# Patient Record
Sex: Male | Born: 1955 | Race: Black or African American | Hispanic: No | Marital: Single | Smoking: Current every day smoker
Health system: Southern US, Community
[De-identification: ages and names within clinical notes are randomized; demographics above are authoritative.]

## PROBLEM LIST (undated history)

## (undated) DIAGNOSIS — K859 Acute pancreatitis without necrosis or infection, unspecified: Secondary | ICD-10-CM

## (undated) DIAGNOSIS — E119 Type 2 diabetes mellitus without complications: Secondary | ICD-10-CM

## (undated) HISTORY — PX: ORIF WRIST FRACTURE: SHX2133

## (undated) HISTORY — PX: TOE SURGERY: SHX1073

## (undated) HISTORY — PX: APPENDECTOMY: SHX54

---

## 2014-04-16 ENCOUNTER — Emergency Department (HOSPITAL_COMMUNITY): Payer: Medicare Other

## 2014-04-16 ENCOUNTER — Emergency Department (HOSPITAL_COMMUNITY)
Admission: EM | Admit: 2014-04-16 | Discharge: 2014-04-16 | Disposition: A | Discharge status: Home / Self Care | Payer: Medicare Other | Attending: Emergency Medicine | Admitting: Emergency Medicine

## 2014-04-16 ENCOUNTER — Encounter (HOSPITAL_COMMUNITY): Payer: Self-pay | Admitting: *Deleted

## 2014-04-16 DIAGNOSIS — N492 Inflammatory disorders of scrotum: Secondary | ICD-10-CM | POA: Diagnosis not present

## 2014-04-16 DIAGNOSIS — Z720 Tobacco use: Secondary | ICD-10-CM | POA: Insufficient documentation

## 2014-04-16 DIAGNOSIS — N50819 Testicular pain, unspecified: Secondary | ICD-10-CM

## 2014-04-16 DIAGNOSIS — E119 Type 2 diabetes mellitus without complications: Secondary | ICD-10-CM | POA: Insufficient documentation

## 2014-04-16 DIAGNOSIS — N508 Other specified disorders of male genital organs: Secondary | ICD-10-CM | POA: Diagnosis present

## 2014-04-16 HISTORY — DX: Type 2 diabetes mellitus without complications: E11.9

## 2014-04-16 LAB — CBC WITH DIFFERENTIAL/PLATELET
BASOS PCT: 0 % (ref 0–1)
Basophils Absolute: 0 10*3/uL (ref 0.0–0.1)
EOS PCT: 5 % (ref 0–5)
Eosinophils Absolute: 0.3 10*3/uL (ref 0.0–0.7)
HEMATOCRIT: 37.7 % — AB (ref 39.0–52.0)
Hemoglobin: 12.2 g/dL — ABNORMAL LOW (ref 13.0–17.0)
LYMPHS ABS: 1.4 10*3/uL (ref 0.7–4.0)
LYMPHS PCT: 26 % (ref 12–46)
MCH: 23.1 pg — ABNORMAL LOW (ref 26.0–34.0)
MCHC: 32.4 g/dL (ref 30.0–36.0)
MCV: 71.3 fL — ABNORMAL LOW (ref 78.0–100.0)
MONOS PCT: 12 % (ref 3–12)
Monocytes Absolute: 0.7 10*3/uL (ref 0.1–1.0)
NEUTROS ABS: 3.1 10*3/uL (ref 1.7–7.7)
Neutrophils Relative %: 57 % (ref 43–77)
PLATELETS: 188 10*3/uL (ref 150–400)
RBC: 5.29 MIL/uL (ref 4.22–5.81)
RDW: 15.2 % (ref 11.5–15.5)
WBC: 5.4 10*3/uL (ref 4.0–10.5)

## 2014-04-16 LAB — URINE MICROSCOPIC-ADD ON

## 2014-04-16 LAB — BASIC METABOLIC PANEL
ANION GAP: 9 (ref 5–15)
BUN: 7 mg/dL (ref 6–23)
CHLORIDE: 100 meq/L (ref 96–112)
CO2: 23 mmol/L (ref 19–32)
Calcium: 8.9 mg/dL (ref 8.4–10.5)
Creatinine, Ser: 0.85 mg/dL (ref 0.50–1.35)
GFR calc non Af Amer: >90 mL/min (ref >90)
Glucose, Bld: 464 mg/dL — ABNORMAL HIGH (ref 70–99)
POTASSIUM: 4.5 mmol/L (ref 3.5–5.1)
Sodium: 132 mmol/L — ABNORMAL LOW (ref 135–145)

## 2014-04-16 LAB — URINALYSIS, ROUTINE W REFLEX MICROSCOPIC
Bilirubin Urine: NEGATIVE
Glucose, UA: >1000 mg/dL — AB
Hgb urine dipstick: NEGATIVE
Ketones, ur: NEGATIVE mg/dL
LEUKOCYTES UA: NEGATIVE
Nitrite: NEGATIVE
Protein, ur: NEGATIVE mg/dL
Specific Gravity, Urine: 1.031 — ABNORMAL HIGH (ref 1.005–1.030)
Urobilinogen, UA: 0.2 mg/dL (ref 0.0–1.0)
pH: 6 (ref 5.0–8.0)

## 2014-04-16 MED ORDER — HYDROMORPHONE HCL 1 MG/ML IJ SOLN
1.0000 mg | Freq: Once | INTRAMUSCULAR | Status: AC
  Administered 2014-04-16: 1 mg via INTRAVENOUS
  Filled 2014-04-16: qty 1

## 2014-04-16 MED ORDER — OXYCODONE-ACETAMINOPHEN 5-325 MG PO TABS: 1.0000 | ORAL_TABLET | ORAL | Status: DC | PRN

## 2014-04-16 MED ORDER — LIDOCAINE-EPINEPHRINE (PF) 2 %-1:200000 IJ SOLN
20.0000 mL | Freq: Once | INTRAMUSCULAR | Status: AC
  Administered 2014-04-16: 20 mL
  Filled 2014-04-16: qty 20

## 2014-04-16 MED ORDER — CLINDAMYCIN HCL 300 MG PO CAPS: 300.0000 mg | ORAL_CAPSULE | Freq: Four times a day (QID) | ORAL | Status: DC

## 2014-04-16 NOTE — ED Notes (Signed)
Pt in c/o right testicle pain x4 days, reports redness and swelling, increased pain with movement, appears in pain

## 2014-04-16 NOTE — ED Notes (Signed)
Pt transported to US.  Blood pulled from IV line was hemolyzed. Will stick when returns from US.

## 2014-04-16 NOTE — ED Notes (Signed)
Md at bedside; Drainage of abscess in process

## 2014-04-16 NOTE — ED Provider Notes (Signed)
CSN: [id number]     Arrival date & time 04/16/14  1544 History   First MD Initiated Contact with Patient 04/16/14 1624     Chief Complaint  Patient presents with  . Testicle Pain     (Consider location/radiation/quality/duration/timing/severity/associated sxs/prior Treatment) Patient is a 59 y.o. male presenting with testicular pain.  Testicle Pain This is a new problem. Episode onset: 5 days ago. The problem occurs constantly. The problem has been gradually worsening. Pertinent negatives include no chest pain, no abdominal pain, no headaches and no shortness of breath. Exacerbated by: palpation. Nothing relieves the symptoms. He has tried nothing for the symptoms.    Past Medical History  Diagnosis Date  . Diabetes mellitus without complication    History reviewed. No pertinent past surgical history. History reviewed. No pertinent family history. History  Substance Use Topics  . Smoking status: Current Every Day Smoker  . Smokeless tobacco: Not on file  . Alcohol Use: Not on file    Review of Systems  Respiratory: Negative for shortness of breath.   Cardiovascular: Negative for chest pain.  Gastrointestinal: Negative for abdominal pain.  Genitourinary: Positive for testicular pain.  Neurological: Negative for headaches.  All other systems reviewed and are negative.     Allergies  Review of patient's allergies indicates no known allergies.  Home Medications   Prior to Admission medications   Medication Sig Start Date End Date Taking? Authorizing Provider  clindamycin (CLEOCIN) 300 MG capsule Take 1 capsule (300 mg total) by mouth 4 (four) times daily. X 7 days 04/16/14   Mirian Mo, MD   BP 125/78 mmHg  Pulse 79  Temp(Src) 97.8 F (36.6 C) (Oral)  Resp 14  SpO2 100% Physical Exam  Constitutional: He is oriented to person, place, and time. He appears well-developed and well-nourished.  HENT:  Head: Normocephalic and atraumatic.  Eyes: Conjunctivae and EOM are  normal.  Neck: Normal range of motion. Neck supple.  Cardiovascular: Normal rate, regular rhythm and normal heart sounds.   Pulmonary/Chest: Effort normal and breath sounds normal. No respiratory distress.  Abdominal: He exhibits no distension. There is no tenderness. There is no rebound and no guarding.  Genitourinary: Cremasteric reflex is not absent on the right side. Left testis shows no mass, no swelling and no tenderness. Left testis is descended. Cremasteric reflex is not absent on the left side.  Scrotal wall with fluctuant swelling, likely abscess  Musculoskeletal: Normal range of motion.  Neurological: He is alert and oriented to person, place, and time.  Skin: Skin is warm and dry.  Vitals reviewed.   ED Course  INCISION AND DRAINAGE Date/Time: 04/16/2014 7:34 PM Performed by: Mirian Mo Authorized by: Mirian Mo Consent: Verbal consent obtained. Type: abscess Body area: anogenital Location details: scrotal wall Anesthesia: local infiltration Local anesthetic: lidocaine 1% with epinephrine Risk factors: underlying structures and thin wall. Scalpel size: 11 Incision type: single straight Complexity: complex Drainage: purulent Drainage amount: copious Wound treatment: wound left open Packing material: none Patient tolerance: Patient tolerated the procedure well with no immediate complications   (including critical care time) Labs Review Labs Reviewed  CBC WITH DIFFERENTIAL - Abnormal; Notable for the following:    Hemoglobin 12.2 (*)    HCT 37.7 (*)    MCV 71.3 (*)    MCH 23.1 (*)    All other components within normal limits  BASIC METABOLIC PANEL - Abnormal; Notable for the following:    Sodium 132 (*)    Glucose, Bld 464 (*)  All other components within normal limits  URINALYSIS, ROUTINE W REFLEX MICROSCOPIC - Abnormal; Notable for the following:    Specific Gravity, Urine 1.031 (*)    Glucose, UA >1000 (*)    All other components within normal  limits  URINE MICROSCOPIC-ADD ON    Imaging Review US Scrotum  04/16/2014   CLINICAL DATA:  59 year old male with subacute right scrotal pain and swelling. Initial encounter.  EXAM: SCROTAL ULTRASOUND  DOPPLER ULTRASOUND OF THE TESTICLES  TECHNIQUE: Complete ultrasound examination of the testicles, epididymis, and other scrotal structures was performed. Color and spectral Doppler ultrasound were also utilized to evaluate blood flow to the testicles.  COMPARISON:  None.  FINDINGS: Right testicle  Measurements: 4.1 x 2.2 x 2.8 cm. No mass or microlithiasis visualized.  Left testicle  Measurements: 4 x 2.1 x 2.8 cm. No mass or microlithiasis visualized.  Right epididymis:  Normal in size and appearance.  Left epididymis:  Normal in size and appearance.  Hydrocele:  None visualized.  Varicocele:  None visualized.  Pulsed Doppler interrogation of both testes demonstrates low resistance arterial and venous waveforms bilaterally.  A 4.3 x 2.8 x 3.6 cm complex oval structure lateral to the right testicle is noted. Overlying erythema is present and the patient is extremely tender in this area. This most likely represents an area of infection/ abscess although there is possible internal vascular flow.  IMPRESSION: 4.3 x 2.8 x 3.6 cm complex oval structure lateral to the right testicle with overlying erythema -suspect area of infection/abscess.  Normal testicles bilaterally.  No evidence testicular torsion.   Electronically Signed   By: Laveda Abbe M.D.   On: 04/16/2014 17:53   [place] Art/ven Flow Abd Pelv Doppler  04/16/2014   CLINICAL DATA:  59 year old male with subacute right scrotal pain and swelling. Initial encounter.  EXAM: SCROTAL ULTRASOUND  DOPPLER ULTRASOUND OF THE TESTICLES  TECHNIQUE: Complete ultrasound examination of the testicles, epididymis, and other scrotal structures was performed. Color and spectral Doppler ultrasound were also utilized to evaluate blood flow to the testicles.  COMPARISON:  None.   FINDINGS: Right testicle  Measurements: 4.1 x 2.2 x 2.8 cm. No mass or microlithiasis visualized.  Left testicle  Measurements: 4 x 2.1 x 2.8 cm. No mass or microlithiasis visualized.  Right epididymis:  Normal in size and appearance.  Left epididymis:  Normal in size and appearance.  Hydrocele:  None visualized.  Varicocele:  None visualized.  Pulsed Doppler interrogation of both testes demonstrates low resistance arterial and venous waveforms bilaterally.  A 4.3 x 2.8 x 3.6 cm complex oval structure lateral to the right testicle is noted. Overlying erythema is present and the patient is extremely tender in this area. This most likely represents an area of infection/ abscess although there is possible internal vascular flow.  IMPRESSION: 4.3 x 2.8 x 3.6 cm complex oval structure lateral to the right testicle with overlying erythema -suspect area of infection/abscess.  Normal testicles bilaterally.  No evidence testicular torsion.   Electronically Signed   By: Laveda Abbe M.D.   On: 04/16/2014 17:53     EKG Interpretation None      MDM   Final diagnoses:  Testicular pain  Scrotal wall abscess    59 y.o. male with pertinent PMH of DM presents with r sided scrotal swelling and pain.  Exam as above with concern for abscess, however due to the amount of swelling, obtained US of scrotum to ensure that the pt did not have an  underlying herniation or testicular pathology, including torsion, as he had been having intermittent pain throughout the day and had testicular tenderness on that side.  US as above with scrotal wall abscess.  I spoke with urology Dr. Micah FlesherMacaormid, who personally reviewed the images, and recommended I&D in the ED.  Specifically asked if this needed urology fu and this was not recommended.  This was performed as above. Complicated drainage due to underlying structures and location.  DC home to fu with PCP.  I have reviewed all laboratory and imaging studies if ordered as above  1. Scrotal  wall abscess   2. Testicular pain         Mirian MoMatthew Korbyn Vanes, MD 04/16/14 [phone]

## 2014-04-16 NOTE — Discharge Instructions (Signed)

## 2014-04-18 ENCOUNTER — Telehealth (HOSPITAL_COMMUNITY): Payer: Self-pay

## 2015-02-08 ENCOUNTER — Encounter (HOSPITAL_COMMUNITY): Payer: Self-pay | Admitting: Emergency Medicine

## 2015-02-08 ENCOUNTER — Emergency Department (HOSPITAL_COMMUNITY): Payer: Medicare Other

## 2015-02-08 ENCOUNTER — Observation Stay (HOSPITAL_COMMUNITY)
Admission: EM | Admit: 2015-02-08 | Discharge: 2015-02-09 | Disposition: A | Discharge status: Home / Self Care | Payer: Medicare Other | Attending: Internal Medicine | Admitting: Internal Medicine

## 2015-02-08 DIAGNOSIS — T68XXXA Hypothermia, initial encounter: Secondary | ICD-10-CM

## 2015-02-08 DIAGNOSIS — Z720 Tobacco use: Secondary | ICD-10-CM | POA: Diagnosis not present

## 2015-02-08 DIAGNOSIS — E872 Acidosis, unspecified: Secondary | ICD-10-CM | POA: Diagnosis present

## 2015-02-08 DIAGNOSIS — G250 Essential tremor: Secondary | ICD-10-CM | POA: Insufficient documentation

## 2015-02-08 DIAGNOSIS — E11649 Type 2 diabetes mellitus with hypoglycemia without coma: Principal | ICD-10-CM | POA: Insufficient documentation

## 2015-02-08 DIAGNOSIS — E089 Diabetes mellitus due to underlying condition without complications: Secondary | ICD-10-CM

## 2015-02-08 DIAGNOSIS — R001 Bradycardia, unspecified: Secondary | ICD-10-CM | POA: Diagnosis present

## 2015-02-08 DIAGNOSIS — Z79899 Other long term (current) drug therapy: Secondary | ICD-10-CM | POA: Insufficient documentation

## 2015-02-08 DIAGNOSIS — E162 Hypoglycemia, unspecified: Secondary | ICD-10-CM | POA: Diagnosis present

## 2015-02-08 DIAGNOSIS — E119 Type 2 diabetes mellitus without complications: Secondary | ICD-10-CM

## 2015-02-08 DIAGNOSIS — IMO0001 Reserved for inherently not codable concepts without codable children: Secondary | ICD-10-CM

## 2015-02-08 DIAGNOSIS — Z794 Long term (current) use of insulin: Secondary | ICD-10-CM | POA: Diagnosis not present

## 2015-02-08 DIAGNOSIS — D539 Nutritional anemia, unspecified: Secondary | ICD-10-CM

## 2015-02-08 DIAGNOSIS — K859 Acute pancreatitis without necrosis or infection, unspecified: Secondary | ICD-10-CM | POA: Diagnosis not present

## 2015-02-08 HISTORY — DX: Acute pancreatitis without necrosis or infection, unspecified: K85.90

## 2015-02-08 LAB — I-STAT CHEM 8, ED
BUN: 6 mg/dL (ref 6–20)
CALCIUM ION: 1.11 mmol/L — AB (ref 1.13–1.30)
CREATININE: 0.9 mg/dL (ref 0.61–1.24)
Chloride: 101 mmol/L (ref 101–111)
GLUCOSE: 73 mg/dL (ref 65–99)
HCT: 36 % — ABNORMAL LOW (ref 39.0–52.0)
Hemoglobin: 12.2 g/dL — ABNORMAL LOW (ref 13.0–17.0)
Potassium: 4.2 mmol/L (ref 3.5–5.1)
Sodium: 141 mmol/L (ref 135–145)
TCO2: 28 mmol/L (ref 0–100)

## 2015-02-08 LAB — CBG MONITORING, ED
GLUCOSE-CAPILLARY: 99 mg/dL (ref 65–99)
GLUCOSE-CAPILLARY: 141 mg/dL — AB (ref 65–99)
Glucose-Capillary: 76 mg/dL (ref 65–99)
Glucose-Capillary: 107 mg/dL — ABNORMAL HIGH (ref 65–99)

## 2015-02-08 LAB — COMPREHENSIVE METABOLIC PANEL
ALT: 26 U/L (ref 17–63)
AST: 42 U/L — ABNORMAL HIGH (ref 15–41)
Albumin: 1.9 g/dL — ABNORMAL LOW (ref 3.5–5.0)
Alkaline Phosphatase: 122 U/L (ref 38–126)
Anion gap: 9 (ref 5–15)
BUN: 7 mg/dL (ref 6–20)
CO2: 28 mmol/L (ref 22–32)
Calcium: 8 mg/dL — ABNORMAL LOW (ref 8.9–10.3)
Chloride: 100 mmol/L — ABNORMAL LOW (ref 101–111)
Creatinine, Ser: 0.91 mg/dL (ref 0.61–1.24)
GFR calc Af Amer: >60 mL/min (ref >60)
GFR calc non Af Amer: >60 mL/min (ref >60)
Glucose, Bld: 69 mg/dL (ref 65–99)
Potassium: 4.2 mmol/L (ref 3.5–5.1)
Sodium: 137 mmol/L (ref 135–145)
Total Bilirubin: 1 mg/dL (ref 0.3–1.2)
Total Protein: 4.7 g/dL — ABNORMAL LOW (ref 6.5–8.1)

## 2015-02-08 LAB — CBC WITH DIFFERENTIAL/PLATELET
BASOS ABS: 0 10*3/uL (ref 0.0–0.1)
Basophils Relative: 0 %
EOS PCT: 1 %
Eosinophils Absolute: 0.1 10*3/uL (ref 0.0–0.7)
HCT: 30.8 % — ABNORMAL LOW (ref 39.0–52.0)
Hemoglobin: 10.2 g/dL — ABNORMAL LOW (ref 13.0–17.0)
LYMPHS PCT: 47 %
Lymphs Abs: 2 10*3/uL (ref 0.7–4.0)
MCH: 22.1 pg — ABNORMAL LOW (ref 26.0–34.0)
MCHC: 33.1 g/dL (ref 30.0–36.0)
MCV: 66.7 fL — AB (ref 78.0–100.0)
MONO ABS: 0.2 10*3/uL (ref 0.1–1.0)
MONOS PCT: 5 %
Neutro Abs: 1.9 10*3/uL (ref 1.7–7.7)
Neutrophils Relative %: 46 %
PLATELETS: 238 10*3/uL (ref 150–400)
RBC: 4.62 MIL/uL (ref 4.22–5.81)
RDW: 15 % (ref 11.5–15.5)
WBC: 4.1 10*3/uL (ref 4.0–10.5)

## 2015-02-08 LAB — URINALYSIS, ROUTINE W REFLEX MICROSCOPIC
BILIRUBIN URINE: NEGATIVE
GLUCOSE, UA: 250 mg/dL — AB
HGB URINE DIPSTICK: NEGATIVE
Ketones, ur: NEGATIVE mg/dL
Leukocytes, UA: NEGATIVE
Nitrite: NEGATIVE
PROTEIN: NEGATIVE mg/dL
Specific Gravity, Urine: 1.012 (ref 1.005–1.030)
UROBILINOGEN UA: 0.2 mg/dL (ref 0.0–1.0)
pH: 6 (ref 5.0–8.0)

## 2015-02-08 LAB — I-STAT CG4 LACTIC ACID, ED
Lactic Acid, Venous: 1.16 mmol/L (ref 0.5–2.0)
Lactic Acid, Venous: 3.54 mmol/L (ref 0.5–2.0)
Lactic Acid, Venous: 3.48 mmol/L (ref 0.5–2.0)

## 2015-02-08 LAB — LACTIC ACID, PLASMA: Lactic Acid, Venous: 3.3 mmol/L (ref 0.5–2.0)

## 2015-02-08 LAB — TROPONIN I

## 2015-02-08 LAB — GLUCOSE, CAPILLARY: Glucose-Capillary: 130 mg/dL — ABNORMAL HIGH (ref 65–99)

## 2015-02-08 LAB — LIPASE, BLOOD: Lipase: 12 U/L (ref 11–51)

## 2015-02-08 MED ORDER — CITALOPRAM HYDROBROMIDE 20 MG PO TABS
20.0000 mg | ORAL_TABLET | Freq: Every day | ORAL | Status: DC
  Administered 2015-02-08 – 2015-02-09 (×2): 20 mg via ORAL
  Filled 2015-02-08 (×2): qty 1

## 2015-02-08 MED ORDER — SODIUM CHLORIDE 0.9 % IJ SOLN
3.0000 mL | Freq: Two times a day (BID) | INTRAMUSCULAR | Status: DC
  Administered 2015-02-08 – 2015-02-09 (×2): 3 mL via INTRAVENOUS

## 2015-02-08 MED ORDER — AMITRIPTYLINE HCL 50 MG PO TABS
50.0000 mg | ORAL_TABLET | Freq: Every day | ORAL | Status: DC
  Administered 2015-02-08: 50 mg via ORAL
  Filled 2015-02-08: qty 1

## 2015-02-08 MED ORDER — OXYCODONE-ACETAMINOPHEN 5-325 MG PO TABS
1.0000 | ORAL_TABLET | Freq: Once | ORAL | Status: AC
  Administered 2015-02-08: 1 via ORAL
  Filled 2015-02-08: qty 1

## 2015-02-08 MED ORDER — INSULIN ASPART 100 UNIT/ML ~~LOC~~ SOLN: 0.0000 [IU] | Freq: Every day | SUBCUTANEOUS | Status: DC

## 2015-02-08 MED ORDER — SODIUM CHLORIDE 0.9 % IV BOLUS (SEPSIS)
500.0000 mL | Freq: Once | INTRAVENOUS | Status: DC
  Administered 2015-02-08: 500 mL via INTRAVENOUS

## 2015-02-08 MED ORDER — PROPRANOLOL HCL 40 MG PO TABS
40.0000 mg | ORAL_TABLET | Freq: Two times a day (BID) | ORAL | Status: DC
  Administered 2015-02-08 – 2015-02-09 (×2): 40 mg via ORAL
  Filled 2015-02-08 (×4): qty 1

## 2015-02-08 MED ORDER — OXYCODONE-ACETAMINOPHEN 5-325 MG PO TABS
1.0000 | ORAL_TABLET | ORAL | Status: DC | PRN
  Administered 2015-02-09: 1 via ORAL
  Administered 2015-02-09: 2 via ORAL
  Administered 2015-02-09: 1 via ORAL
  Administered 2015-02-09: 2 via ORAL
  Filled 2015-02-08: qty 1
  Filled 2015-02-08: qty 2
  Filled 2015-02-08: qty 1
  Filled 2015-02-08: qty 2

## 2015-02-08 MED ORDER — PIPERACILLIN-TAZOBACTAM 3.375 G IVPB 30 MIN
3.3750 g | Freq: Once | INTRAVENOUS | Status: DC
Start: 2015-02-08 — End: 2015-02-08

## 2015-02-08 MED ORDER — SODIUM CHLORIDE 0.9 % IV BOLUS (SEPSIS)
1000.0000 mL | Freq: Once | INTRAVENOUS | Status: AC
  Administered 2015-02-08: 1000 mL via INTRAVENOUS

## 2015-02-08 MED ORDER — DEXTROSE 50 % IV SOLN
1.0000 | Freq: Once | INTRAVENOUS | Status: AC
  Administered 2015-02-08: 50 mL via INTRAVENOUS
  Filled 2015-02-08: qty 50

## 2015-02-08 MED ORDER — INSULIN GLARGINE 100 UNIT/ML ~~LOC~~ SOLN
5.0000 [IU] | Freq: Every day | SUBCUTANEOUS | Status: DC
  Administered 2015-02-08: 5 [IU] via SUBCUTANEOUS
  Filled 2015-02-08 (×2): qty 0.05

## 2015-02-08 MED ORDER — INSULIN ASPART 100 UNIT/ML ~~LOC~~ SOLN
0.0000 [IU] | Freq: Three times a day (TID) | SUBCUTANEOUS | Status: DC
  Administered 2015-02-09: 1 [IU] via SUBCUTANEOUS

## 2015-02-08 MED ORDER — VANCOMYCIN HCL 10 G IV SOLR
1500.0000 mg | Freq: Once | INTRAVENOUS | Status: DC
  Filled 2015-02-08: qty 1500

## 2015-02-08 MED ORDER — SODIUM CHLORIDE 0.9 % IV BOLUS (SEPSIS)
2000.0000 mL | Freq: Once | INTRAVENOUS | Status: AC
  Administered 2015-02-08: 2000 mL via INTRAVENOUS

## 2015-02-08 NOTE — ED Notes (Signed)
PTs CBG 76 reported ot RN, [employer]

## 2015-02-08 NOTE — ED Notes (Signed)
From home via [facility], pt was hypoglycemic (CBG 30), bradycardic (HR 36) and altered on scene.  CBG 104 after 1 amp D50, HR 59 on arrival, A/O X4, no pain or distress

## 2015-02-08 NOTE — Discharge Instructions (Signed)

## 2015-02-08 NOTE — ED Notes (Signed)
Pt remains monitored by blood pressure, pulse ox, an 12 lead. Phlebotomy at bedside.

## 2015-02-08 NOTE — ED Provider Notes (Signed)
I assumed care of Collin Goodwin from [employer], PA-C at their end-of-shift check-out. Patient is a 59 y.o. male who presented for evaluation of Hypoglycemia and Bradycardia  I have reviewed their documentation of the patient's HPI, ROS, Physical Exam.  Please refer to their MDM and documentation for course prior to now. Treatments, Labs, and Imaging personally viewed by myself & considered in my MDM.  BP 121/72 mmHg  Pulse 75  Temp(Src) 98.1 F (36.7 C) (Oral)  Resp 14  Ht  (1.981 m)  Wt 199 lb 9.6 oz (90.538 kg)  BMI 23.07 kg/m2  SpO2 100% Results for orders placed or performed during the hospital encounter of 02/08/15  Comprehensive metabolic panel  Result Value Ref Range   Sodium 137 135 - 145 mmol/L   Potassium 4.2 3.5 - 5.1 mmol/L   Chloride 100 (L) 101 - 111 mmol/L   CO2 28 22 - 32 mmol/L   Glucose, Bld 69 65 - 99 mg/dL   BUN 7 6 - 20 mg/dL   Creatinine, Ser 8.29 0.61 - 1.24 mg/dL   Calcium 8.0 (L) 8.9 - 10.3 mg/dL   Total Protein 4.7 (L) 6.5 - 8.1 g/dL   Albumin 1.9 (L) 3.5 - 5.0 g/dL   AST 42 (H) 15 - 41 U/L   ALT 26 17 - 63 U/L   Alkaline Phosphatase 122 38 - 126 U/L   Total Bilirubin 1.0 0.3 - 1.2 mg/dL   GFR calc non Af Amer >60 >60 mL/min   GFR calc Af Amer >60 >60 mL/min   Anion gap 9 5 - 15  Lipase, blood  Result Value Ref Range   Lipase 12 11 - 51 U/L  Troponin I  Result Value Ref Range   Troponin I <0.03 <0.031 ng/mL  Urinalysis, Routine w reflex microscopic (not at [facility])  Result Value Ref Range   Color, Urine YELLOW YELLOW   APPearance CLEAR CLEAR   Specific Gravity, Urine 1.012 1.005 - 1.030   pH 6.0 5.0 - 8.0   Glucose, UA 250 (A) NEGATIVE mg/dL   Hgb urine dipstick NEGATIVE NEGATIVE   Bilirubin Urine NEGATIVE NEGATIVE   Ketones, ur NEGATIVE NEGATIVE mg/dL   Protein, ur NEGATIVE NEGATIVE mg/dL   Urobilinogen, UA 0.2 0.0 - 1.0 mg/dL   Nitrite NEGATIVE NEGATIVE   Leukocytes, UA NEGATIVE NEGATIVE  CBC with Differential  Result Value  Ref Range   WBC 4.1 4.0 - 10.5 K/uL   RBC 4.62 4.22 - 5.81 MIL/uL   Hemoglobin 10.2 (L) 13.0 - 17.0 g/dL   HCT 56.2 (L) 13.0 - 86.5 %   MCV 66.7 (L) 78.0 - 100.0 fL   MCH 22.1 (L) 26.0 - 34.0 pg   MCHC 33.1 30.0 - 36.0 g/dL   RDW 78.4 69.6 - 29.5 %   Platelets 238 150 - 400 K/uL   Neutrophils Relative % 46 %   Neutro Abs 1.9 1.7 - 7.7 K/uL   Lymphocytes Relative 47 %   Lymphs Abs 2.0 0.7 - 4.0 K/uL   Monocytes Relative 5 %   Monocytes Absolute 0.2 0.1 - 1.0 K/uL   Eosinophils Relative 1 %   Eosinophils Absolute 0.1 0.0 - 0.7 K/uL   Basophils Relative 0 %   Basophils Absolute 0.0 0.0 - 0.1 K/uL  Lactic acid, plasma  Result Value Ref Range   Lactic Acid, Venous 3.3 (HH) 0.5 - 2.0 mmol/L  Protime-INR  Result Value Ref Range   Prothrombin Time 15.3 (H) 11.6 - 15.2  seconds   INR 1.19 0.00 - 1.49  Glucose, capillary  Result Value Ref Range   Glucose-Capillary 130 (H) 65 - 99 mg/dL  Glucose, capillary  Result Value Ref Range   Glucose-Capillary 212 (H) 65 - 99 mg/dL  Glucose, capillary  Result Value Ref Range   Glucose-Capillary 163 (H) 65 - 99 mg/dL  I-Stat CG4 Lactic Acid, ED  Result Value Ref Range   Lactic Acid, Venous 1.16 0.5 - 2.0 mmol/L  CBG monitoring, ED  Result Value Ref Range   Glucose-Capillary 76 65 - 99 mg/dL  I-Stat Chem 8, ED  Result Value Ref Range   Sodium 141 135 - 145 mmol/L   Potassium 4.2 3.5 - 5.1 mmol/L   Chloride 101 101 - 111 mmol/L   BUN 6 6 - 20 mg/dL   Creatinine, Ser 1.610.90 0.61 - 1.24 mg/dL   Glucose, Bld 73 65 - 99 mg/dL   Calcium, Ion 0.961.11 (L) 1.13 - 1.30 mmol/L   TCO2 28 0 - 100 mmol/L   Hemoglobin 12.2 (L) 13.0 - 17.0 g/dL   HCT 04.536.0 (L) 40.939.0 - 81.152.0 %  CBG monitoring, ED  Result Value Ref Range   Glucose-Capillary 99 65 - 99 mg/dL  I-Stat CG4 Lactic Acid, ED  Result Value Ref Range   Lactic Acid, Venous 3.54 (HH) 0.5 - 2.0 mmol/L   Comment NOTIFIED PHYSICIAN   POC CBG, ED  Result Value Ref Range   Glucose-Capillary 107 (H) 65 -  99 mg/dL  I-Stat CG4 Lactic Acid, ED  Result Value Ref Range   Lactic Acid, Venous 3.48 (HH) 0.5 - 2.0 mmol/L   Comment NOTIFIED PHYSICIAN   CBG monitoring, ED  Result Value Ref Range   Glucose-Capillary 141 (H) 65 - 99 mg/dL   Dg Chest Portable 1 View  02/08/2015  CLINICAL DATA:  Hypoglycemia.  Diabetes. EXAM: PORTABLE CHEST 1 VIEW COMPARISON:  None. FINDINGS: The heart size and mediastinal contours are within normal limits. Both lungs are clear. The visualized skeletal structures are unremarkable. IMPRESSION: No active disease. Electronically Signed   By: Signa Kellaylor  Stroud M.D.   On: 02/08/2015 14:43   Upon assumption of the patient's care, they require repeat lactic acid and BG following episode of symptomatic hypoglycemia. Patient alert well appearing and tolerating PO with no AMS.  I bolused patient with and additional D50 and 2 L IVF for lactic acid clearance given patient markedly dehydrated on exam.  ED course update, reveals unable to clear lactic acidosis therefore I I obtained consultation with the Hospitalist service for concerns of unknown etiology causing hypoglycemic episodes. I discussed the patients clinical course including their H&P, as well as, their diagnostic studies. Based upon that discussion, we've decided that the patient requires admission at this time.   Clinical Impression:  1. Hypoglycemia    Disposition:  Patient care discussed with Dr. Jeraldine LootsLockwood, who oversaw their evaluation & treatment & voiced agreement. House Officer: Jonette EvaBrad Cesilia Shinn, MD, Emergency Medicine.  Jonette EvaBrad Kyisha Fowle, MD 02/09/15 [phone]  Gerhard Munchobert Lockwood, MD 02/10/15 [phone]

## 2015-02-08 NOTE — H&P (Signed)
History and Physical  Patient Name: Collin Goodwin     WGN:[id number]    DOB: April 28, 1955    DOA: 02/08/2015 Referring physician: Jonette EvaBrad Chapman, MD PCP: Dr. [place]ehuda [place]Spears in [place]      Chief Complaint: Hypoglycemia and confusion  HPI: Collin LightningCecil Goodwin is a 59 y.o. male with a past medical history significant for severe pancreatitis 5 years ago resulting in DM who presents with hypothermia, hypoglycemia, brief confusion, bradycardia and lactic acidosis.  The [place] lives in [place] and is visiting town for the football game, staying with friends/family.  He was in his usual state of health until about 3 days ago when he started to have recurrent morning hypoglycemia.  Today he woke up, his morning blood sugar was 140 mg/dL, and before he had eaten anything or taken any insulin he felt symptoms of low blood sugar, sweats, clouded thinking, and confusion.  His friend/family that he was staying with called EMS who found him with blood sugar of 39 g/dL, heart rate 36 bpm, and confused.   He was given 1 amp of D50 and by the time of arrival in the ER his rectal temperature was 91.51F and he was put on a bear hugger, but the patient was alert and oriented and had no complaints, and his blood sugar and blood pressure were normal.    He had no leukocytosis and initial lactate was 1.16 normal is per liter. He did have a slightly elevated AST of 42  U/L, and interestingly her repeat lactic acid level was 3.54 mmol/L, so he was given 2 L normal saline and follow-up lactic acid level was still greater than 3 and so [facility] was asked to admit for observation.   Of note, the patient  draws up his own Lantus and Apridra, and reports that he has been using new syringes and needles in the last four or five days.  No other recent changes to medicines.     Review of Systems:  Pt complains of recurrent hypoglycemia in the last several days. Pt denies any  fever, chills, sweats, emesis, cough, abdominal pain, diarrhea,  hematochezia, skin infections, dysuria, lower urinary tract symptoms.  All other systems negative except as just noted or noted in the history of present illness.  No Known Allergies  Prior to Admission medications   Medication Sig Start Date End Date Taking? Authorizing Provider  amitriptyline (ELAVIL) 50 MG tablet Take 50 mg by mouth at bedtime.   Yes Historical Provider, MD  citalopram (CELEXA) 20 MG tablet Take 20 mg by mouth daily.   Yes Historical Provider, MD  insulin glargine (LANTUS) 100 UNIT/ML injection Inject 10 Units into the skin at bedtime.   Yes Historical Provider, MD  insulin glulisine (APIDRA) 100 UNIT/ML injection Inject 0-8 Units into the skin 3 (three) times daily before meals. Sliding scale   Yes Historical Provider, MD  propranolol (INDERAL) 80 MG tablet Take 80 mg by mouth 2 (two) times daily.   Yes Historical Provider, MD  Vitamin D, Ergocalciferol, (DRISDOL) 50000 UNITS CAPS capsule Take 50,000 Units by mouth. Monday and Thursday   Yes Historical Provider, MD  oxyCODONE-acetaminophen (PERCOCET/ROXICET) 5-325 MG per tablet Take 1-2 tablets by mouth every 4 (four) hours as needed for severe pain. 04/16/14   Mirian MoMatthew Gentry, MD    Past Medical History  Diagnosis Date  . Diabetes mellitus without complication (HCC)   . Pancreatitis     Past Surgical History  Procedure Laterality Date  . Appendectomy    .  Orif wrist fracture    . Toe surgery      Family history: family history includes Arthritis in his mother; Heart disease in his brother; Liver cancer in his brother; Lupus in his brother; Pancreatitis in his father.  Social History: Patient lives alone in [place].  He is disabled after his pancreatitis which occurred in [place]. He formerly worked on IT trainer. He is a current smoker, and drink alcohol rarely. He is independent with all IADLs and ADLs.       Physical Exam: BP 114/78 mmHg  Pulse 84  Temp(Src) 98.4 F (36.9 C) (Oral)  Resp 18  Ht   (1.981 m)  Wt 80.287 kg (177 lb)  BMI 20.46 kg/m2  SpO2 98% General appearance: Well-developed, adult male, alert and in no acute distress.   Eyes: Anicteric, conjunctiva pink, lids and lashes normal.     ENT: No nasal deformity, discharge, or epistaxis.  OP moist without lesions.   Skin: Warm and dry.  No jaundice.  No suspicious rashes or lesions hypermelanosis on back. Cardiac: RRR, nl S1-S2, no murmurs appreciated.  Capillary refill is brisk.  No LE edema.  Radial pulses 2+ and symmetric. Respiratory: Normal respiratory rate and rhythm.  CTAB without rales or wheezes. Abdomen: Abdomen soft without rigidity.  No TTP. No ascites, distension.  No chronic stigmata of liver disease. MSK: No deformities or effusions. Neuro: Sensorium intact and responding to questions, attention normal.  Speech is fluent.  Moves all extremities equally and with normal coordination.    Psych: Behavior appropriate.  Affect normal.  No evidence of aural or visual hallucinations or delusions.       Labs on Admission:  The metabolic panel shows normal sodium, potassium, high normal bicarbonate, and normal serum creatinine. Dan and Is normal. The albumin is low at 1.9 g/dL. The AST is just above the upper limit of normal, and the ALT and total bilirubin are normal. The lactic acid level was initially 1.16 mmol per liter and increase to 3.54 mmol per liter and then 3.48 mmol per liter after fluids.  A troponin was negative. The lipase was normal.  The complete blood count shows  chronic microcytic anemia worse than previous. Normal WBC and platelet count.   Radiological Exams on Admission: Personally reviewed: Dg Chest Portable 1 View 02/08/2015   No airspace disease.   EKG: Independently reviewed. Poor quality baseline, appears NSR.    Assessment/Plan  1. Elevated lactic acid, bradycardia, hypothermia and hypoglycemia:  This is new.  I suspect that this is the result of patient overdosing himself  accidentally with insulin because of his new needles, leading to hypoglycemia, hypothermia and bradycardia.  Infection is in the differential, but on my evaluation the patient appears so well I think it is reasonable to hold antibiotics at this time (and to start if he develops leukocytosis, fever, or hypotension).  Adrenal crisis seems unlikely given his hemodynamic instability, but adrenal insufficiency can cause labile sugars, as can hypothyroidism.    -Tele -Accuchecks q4hrs -TSH, AM cortisol and repeat lactic acid -Blood cultures -Reduce propranolol dose as this is relatively contraindicated in those prone to hypoglycemia  2. IDDM:  -Give half normal glargine dose of 10 U nightly -Sliding scale corrections -HgbA1c -Check C-peptide to confirm under-production of insulin -Diabetes education to evaluate insulin syringe use when patietn brings in his syringes tomorrow from home  3. Chronic microcytic anemia, unclear etiology:  -Ferritin, reticulocytes, iron/TIBC  4. Elevated AST:  -Repeat CMP tomorrow -INR  5. Wrist pain:  Stable.  -Percocet as needed for pain  6. Essential tremor:  Stable.  -Continue propranolol, lower dose due to hypoglycemia     DVT PPx: Low risk Diet: Carb modified Consultants: Diabetes education  Code Status: Full Medical decision making: What exists of the patient's previous chart was reviewed in depth and the case was discussed with Dr. Sibyl Parr. Patient seen 8:51 PM on 02/08/2015.  Disposition Plan:  Admit for observation and repeat lactic acid.  If normalizes, and blood sugar is stable, could conceivably discharge to home tomorrow afternoon.      Alberteen Sam [facility] Pager [phone]

## 2015-02-08 NOTE — ED Notes (Signed)
Pt was able to pivot to bedside commode. Pt had large bowel movement. Pt assisted self back to bed. When checking on pt pt stated "he felt a little woozy still and felt like his mind wasn't communicating with his legs. " pt remains monitored by blood pressure, pulse ox, and 12 lead. Rep[place] to [place], [place]

## 2015-02-08 NOTE — ED Notes (Signed)
Pt remains monitored by blood pressure, pulse ox, and 12 lead. Pts CBG 99 reported to [place], [place]

## 2015-02-08 NOTE — ED Notes (Signed)
Pts CBG 107 reported to RN, [employer]

## 2015-02-08 NOTE — ED Provider Notes (Signed)
CSN: [id number]     Arrival date & time 02/08/15  1308 History   First MD Initiated Contact with Patient 02/08/15 1311     Chief Complaint  Patient presents with  . Hypoglycemia  . Bradycardia   HPI   59 year old male presents today with hyperglycemia, hypothermia, bradycardia. Patient is a insulin-dependent diabetic who takes 20 units of Endocet night, with sliding-scale in the morning. Patient reports that usually upon awakening his blood sugar is around 100, but states that over the last 4 days he is woken up and his blood sugar has been significantly low around the 60s. Patient has had previous episodes similar in the past.  Patient reports that he contacted his endocrinologist who informed him that he should reduce his Lantus dose by half at night, continue sliding scale insulin in the morning. Patient reports he did this yesterday, woke up this morning with the blood sugar in the 140s, did not use his sliding scale insulin, but still had an episode of hypoglycemia. Patient reports he's been feeling otherwise well, denies any fever, weight loss, chills, nausea, vomiting, abdominal pain, changes in the color clarity characteristics of this urinary stool. Patient is never had any symptoms similar to this in the past. Patient's significant other found him screaming for help this morning, with EMS transfer here. Patient does not recall events surrounding EMS transport.   Past Medical History  Diagnosis Date  . Diabetes mellitus without complication (HCC)   . Pancreatitis    No past surgical history on file. No family history on file. Social History  Substance Use Topics  . Smoking status: Current Every Day Smoker  . Smokeless tobacco: None  . Alcohol Use: None    Review of Systems  All other systems reviewed and are negative.   Allergies  Review of patient's allergies indicates no known allergies.  Home Medications   Prior to Admission medications   Medication Sig Start Date End  Date Taking? Authorizing Provider  amitriptyline (ELAVIL) 50 MG tablet Take 50 mg by mouth at bedtime.   Yes Historical Provider, MD  citalopram (CELEXA) 20 MG tablet Take 20 mg by mouth daily.   Yes Historical Provider, MD  insulin glargine (LANTUS) 100 UNIT/ML injection Inject 10 Units into the skin at bedtime.   Yes Historical Provider, MD  insulin glulisine (APIDRA) 100 UNIT/ML injection Inject 0-8 Units into the skin 3 (three) times daily before meals. Sliding scale   Yes Historical Provider, MD  propranolol (INDERAL) 80 MG tablet Take 80 mg by mouth 2 (two) times daily.   Yes Historical Provider, MD  Vitamin D, Ergocalciferol, (DRISDOL) 50000 UNITS CAPS capsule Take 50,000 Units by mouth. Monday and Thursday   Yes Historical Provider, MD  oxyCODONE-acetaminophen (PERCOCET/ROXICET) 5-325 MG per tablet Take 1-2 tablets by mouth every 4 (four) hours as needed for severe pain. 04/16/14   Mirian Mo, MD   BP 99/73 mmHg  Pulse 73  Temp(Src) 97.6 F (36.4 C) (Oral)  Resp 12  Ht  (1.981 m)  Wt 177 lb (80.287 kg)  BMI 20.46 kg/m2  SpO2 100%   Physical Exam  Constitutional: He is oriented to person, place, and time. He appears well-developed and well-nourished.  Alert and oriented   HENT:  Head: Normocephalic and atraumatic.  Eyes: Conjunctivae are normal. Pupils are equal, round, and reactive to light. Right eye exhibits no discharge. Left eye exhibits no discharge. No scleral icterus.  Neck: Normal range of motion. No JVD present. No tracheal  deviation present.  Cardiovascular: Normal rate, regular rhythm and intact distal pulses.  Exam reveals no gallop and no friction rub.   No murmur heard. Pulmonary/Chest: Effort normal and breath sounds normal. No stridor. No respiratory distress. He has no wheezes. He has no rales. He exhibits no tenderness.  Abdominal: Soft. He exhibits no distension and no mass. There is no tenderness. There is no rebound and no guarding.  Musculoskeletal:  Normal range of motion. He exhibits no edema or tenderness.  Cast on right arm  Neurological: He is alert and oriented to person, place, and time. Coordination normal.  Skin: Skin is warm and dry. No rash noted. No erythema. No pallor.  Psychiatric: He has a normal mood and affect. His behavior is normal. Judgment and thought content normal.  Nursing note and vitals reviewed.      ED Course  Procedures (including critical care time) Labs Review Labs Reviewed  COMPREHENSIVE METABOLIC PANEL - Abnormal; Notable for the following:    Chloride 100 (*)    Calcium 8.0 (*)    Total Protein 4.7 (*)    Albumin 1.9 (*)    AST 42 (*)    All other components within normal limits  CBC WITH DIFFERENTIAL/PLATELET - Abnormal; Notable for the following:    Hemoglobin 10.2 (*)    HCT 30.8 (*)    MCV 66.7 (*)    MCH 22.1 (*)    All other components within normal limits  I-STAT CHEM 8, ED - Abnormal; Notable for the following:    Calcium, Ion 1.11 (*)    Hemoglobin 12.2 (*)    HCT 36.0 (*)    All other components within normal limits  LIPASE, BLOOD  TROPONIN I  CBC WITH DIFFERENTIAL/PLATELET  URINALYSIS, ROUTINE W REFLEX MICROSCOPIC (NOT AT [facility])  I-STAT CG4 LACTIC ACID, ED  CBG MONITORING, ED  CBG MONITORING, ED  I-STAT CG4 LACTIC ACID, ED  CBG MONITORING, ED    Imaging Review Dg Chest Portable 1 View  02/08/2015  CLINICAL DATA:  Hypoglycemia.  Diabetes. EXAM: PORTABLE CHEST 1 VIEW COMPARISON:  None. FINDINGS: The heart size and mediastinal contours are within normal limits. Both lungs are clear. The visualized skeletal structures are unremarkable. IMPRESSION: No active disease. Electronically Signed   By: Signa Kell M.D.   On: 02/08/2015 14:43   I have personally reviewed and evaluated these images and lab results as part of my medical decision-making.   EKG Interpretation   Date/Time:  Sunday February 08 2015 13:12:41 EDT Ventricular Rate:  85 PR Interval:  90 QRS Duration:  179 QT Interval:  558 QTC Calculation: 664 R Axis:   44 Text Interpretation:  Sinus rhythm Multiform ventricular premature  complexes Consider right atrial enlargement Nonspecific intraventricular  conduction delay Artifact in lead(s) I II III aVR aVL aVF V1 V2 V3 V4 V5  V6 No old tracing to compare Confirmed by GOLDSTON  MD, SCOTT ([id number]) on  02/08/2015 1:15:44 PM      MDM   Final diagnoses:  Hypoglycemia    Labs: cbc, CMP, Lipase, Trop, I stat Lactic acid, I stat chem 8, CBG- , urinalysis   Imaging: DG chest-  No acute abnormality   Consults:  Therapeutics: none  Discharge Meds:   Assessment/Plan: 50 YOM presents today with hypoglycemia. Pt was hypothermic originally here in the ED. Symptoms improved with D-50 and gently re-warming. Pt was alert and oriented with no acute complaints at the time of my evaluation. Pt has close contact  with his endocrinologist and has appointment scheduled for tomorrow. Pt will be provided food, with re-evaluation of CBg. If no drop in CBG pt likely stable for discharge home and follow-up with endocrinologist tomorrow for further evaluation. Pt care transferred to oncoming provider for disposition pending repeat CBG.          Eyvonne MechanicJeffrey Ronae Noell, PA-C 02/08/15 1629  Pricilla LovelessScott Goldston, MD 02/12/15 1600

## 2015-02-09 DIAGNOSIS — E162 Hypoglycemia, unspecified: Secondary | ICD-10-CM | POA: Diagnosis not present

## 2015-02-09 DIAGNOSIS — R001 Bradycardia, unspecified: Secondary | ICD-10-CM

## 2015-02-09 DIAGNOSIS — E119 Type 2 diabetes mellitus without complications: Secondary | ICD-10-CM | POA: Diagnosis not present

## 2015-02-09 DIAGNOSIS — E872 Acidosis: Secondary | ICD-10-CM | POA: Diagnosis not present

## 2015-02-09 DIAGNOSIS — Z794 Long term (current) use of insulin: Secondary | ICD-10-CM

## 2015-02-09 LAB — PROTIME-INR
INR: 1.19 (ref 0.00–1.49)
Prothrombin Time: 15.3 seconds — ABNORMAL HIGH (ref 11.6–15.2)

## 2015-02-09 LAB — COMPREHENSIVE METABOLIC PANEL
ALBUMIN: 1.4 g/dL — AB (ref 3.5–5.0)
ALK PHOS: 95 U/L (ref 38–126)
ALT: 25 U/L (ref 17–63)
ANION GAP: 6 (ref 5–15)
AST: 53 U/L — ABNORMAL HIGH (ref 15–41)
BILIRUBIN TOTAL: 0.5 mg/dL (ref 0.3–1.2)
BUN: 5 mg/dL — ABNORMAL LOW (ref 6–20)
CHLORIDE: 110 mmol/L (ref 101–111)
CO2: 26 mmol/L (ref 22–32)
Calcium: 7.5 mg/dL — ABNORMAL LOW (ref 8.9–10.3)
Creatinine, Ser: 0.81 mg/dL (ref 0.61–1.24)
GFR calc non Af Amer: >60 mL/min (ref >60)
Glucose, Bld: 150 mg/dL — ABNORMAL HIGH (ref 65–99)
Potassium: 4.3 mmol/L (ref 3.5–5.1)
Sodium: 142 mmol/L (ref 135–145)
Total Protein: 3.8 g/dL — ABNORMAL LOW (ref 6.5–8.1)

## 2015-02-09 LAB — GLUCOSE, CAPILLARY
GLUCOSE-CAPILLARY: 108 mg/dL — AB (ref 65–99)
GLUCOSE-CAPILLARY: 148 mg/dL — AB (ref 65–99)
GLUCOSE-CAPILLARY: 212 mg/dL — AB (ref 65–99)
GLUCOSE-CAPILLARY: 84 mg/dL (ref 65–99)
Glucose-Capillary: 163 mg/dL — ABNORMAL HIGH (ref 65–99)
Glucose-Capillary: 107 mg/dL — ABNORMAL HIGH (ref 65–99)

## 2015-02-09 LAB — IRON AND TIBC: IRON: 84 ug/dL (ref 45–182)

## 2015-02-09 LAB — CBC
HCT: 27.6 % — ABNORMAL LOW (ref 39.0–52.0)
HEMOGLOBIN: 9 g/dL — AB (ref 13.0–17.0)
MCH: 22 pg — AB (ref 26.0–34.0)
MCHC: 32.6 g/dL (ref 30.0–36.0)
MCV: 67.3 fL — AB (ref 78.0–100.0)
Platelets: 208 10*3/uL (ref 150–400)
RBC: 4.1 MIL/uL — AB (ref 4.22–5.81)
RDW: 15.1 % (ref 11.5–15.5)
WBC: 6 10*3/uL (ref 4.0–10.5)

## 2015-02-09 LAB — LACTIC ACID, PLASMA: Lactic Acid, Venous: 2.8 mmol/L (ref 0.5–2.0)

## 2015-02-09 LAB — CORTISOL: CORTISOL PLASMA: 4.3 ug/dL

## 2015-02-09 LAB — RETICULOCYTES
RBC.: 4.1 MIL/uL — ABNORMAL LOW (ref 4.22–5.81)
Retic Count, Absolute: 53.3 10*3/uL (ref 19.0–186.0)
Retic Ct Pct: 1.3 % (ref 0.4–3.1)

## 2015-02-09 LAB — FERRITIN: FERRITIN: 323 ng/mL (ref 24–336)

## 2015-02-09 LAB — TSH: TSH: 1.599 u[IU]/mL (ref 0.350–4.500)

## 2015-02-09 LAB — CORTISOL-AM, BLOOD: CORTISOL - AM: 4.8 ug/dL — AB (ref 6.7–22.6)

## 2015-02-09 MED ORDER — PROPRANOLOL HCL 80 MG PO TABS
40.0000 mg | ORAL_TABLET | Freq: Two times a day (BID) | ORAL | Status: DC
Start: 2015-02-09 — End: 2015-03-19

## 2015-02-09 MED ORDER — SODIUM CHLORIDE 0.9 % IV BOLUS (SEPSIS)
500.0000 mL | Freq: Once | INTRAVENOUS | Status: AC
  Administered 2015-02-09: 500 mL via INTRAVENOUS

## 2015-02-09 MED ORDER — INSULIN GLARGINE 100 UNIT/ML ~~LOC~~ SOLN: 5.0000 [IU] | Freq: Every day | SUBCUTANEOUS | Status: DC

## 2015-02-09 MED ORDER — SODIUM CHLORIDE 0.9 % IV SOLN: INTRAVENOUS | Status: DC

## 2015-02-09 MED ORDER — OXYCODONE-ACETAMINOPHEN 5-325 MG PO TABS: 1.0000 | ORAL_TABLET | ORAL | Status: DC | PRN

## 2015-02-09 NOTE — Progress Notes (Signed)
Inpatient Diabetes Program Recommendations  AACE/ADA: New Consensus Statement on Inpatient Glycemic Control (2015)  Target Ranges:  Prepandial:   less than 140 mg/dL      Peak postprandial:   less than 180 mg/dL (1-2 hours)      Critically ill patients:  140 - 180 mg/dL   Review of Glycemic Control  Diabetes history: DM Outpatient Diabetes medications: Lantus 10 units, Apidra 0-8 units TID Current orders for Inpatient glycemic control: Lantus 5 units, Novolog Sensitive TID   Note patient's fasting looks great at 107 mg/dl on the 5 units of basal here inpatient. Will see patient today and evaluate home insulin syringes and method in which he draws up insulin.  Thanks,  Christena DeemShannon Issiah Huffaker RN, MSN, [facility] Inpatient Diabetes Coordinator Team Pager [phone] (8a-5p)

## 2015-02-09 NOTE — Discharge Summary (Signed)
Physician Discharge Summary   Patient ID: Collin Goodwin MRN: [id number] DOB/AGE: 1955/08/20 59 y.o.  Admit date: 02/08/2015 Discharge date: 02/09/2015  Primary Care Physician:  PROVIDER NOT IN SYSTEM  Discharge Diagnoses:    . Hypoglycemia- resolved  . Lactic acidosis improving  . Hypothermia resolved  . Bradycardia resolved  Consults:  Diabetic coordinator    Recommendations for Outpatient Follow-up:  Lantus is decreased to  at bed time.     TESTS THAT NEED FOLLOW-UP HbA1C , please repeat cortisol level and ACTH stimulation test if still low. LFTs   DIET: Carb modified diet    Allergies:  No Known Allergies   Discharge Medications:   Medication List    TAKE these medications        amitriptyline 50 MG tablet  Commonly known as:  ELAVIL  Take 50 mg by mouth at bedtime.     APIDRA 100 UNIT/ML injection  Generic drug:  insulin glulisine  Inject 0-8 Units into the skin 3 (three) times daily before meals. Sliding scale     citalopram 20 MG tablet  Commonly known as:  CELEXA  Take 20 mg by mouth daily.     insulin glargine 100 UNIT/ML injection  Commonly known as:  LANTUS  Inject 0.05 mLs (5 Units total) into the skin at bedtime.     oxyCODONE-acetaminophen 5-325 MG tablet  Commonly known as:  PERCOCET/ROXICET  Take 1-2 tablets by mouth every 4 (four) hours as needed for severe pain.     propranolol 80 MG tablet  Commonly known as:  INDERAL  Take 0.5 tablets (40 mg total) by mouth 2 (two) times daily.     Vitamin D (Ergocalciferol) 50000 UNITS Caps capsule  Commonly known as:  DRISDOL  Take 50,000 Units by mouth. Monday and Thursday         Brief H and P: For complete details please refer to admission H and P, but in brief Per Dr. Sheryn Bison note on 02/08/15 Collin Goodwin is a 59 y.o. male with a past medical history significant for severe pancreatitis 5 years ago resulting in DM who presentedwith hypothermia, hypoglycemia, brief confusion,  bradycardia and lactic acidosis. The patient lives in [place] and is visiting town for the football game, staying with friends/family. He was in his usual state of health until about 3 days ago when he started to have recurrent morning hypoglycemia.On the day of admission, he woke up, his morning blood sugar was 140 mg/dL, and before he had eaten anything or taken any insulin he felt symptoms of low blood sugar, sweats, clouded thinking, and confusion. His friend/family that he was staying with called EMS who found him with blood sugar of 39 g/dL, heart rate 36 bpm, and confused.  He was given 1 amp of D50 and by the time of arrival in the ER his rectal temperature was 91.19F and he was put on a bear hugger, but the patient was alert and oriented and had no complaints, and his blood sugar and blood pressure were normal.  He had no leukocytosis and initial lactate was 1.16 normal is per liter. He did have a slightly elevated AST of 42 U/L, repeat lactic acid was increased to 3.54.  Patient was admitted for observation. Of note, the patient draws up his own Lantus and Apridra, and reports that he has been using new syringes and needles in the last four or five days. No other recent changes to medicines.    Hospital Course:   Elevated lactic  acid, bradycardia, hypothermia and hypoglycemia:  Likely as a result of patient overdosing himself accidentally with insulin because of his new needles, leading to hypoglycemia, hypothermia and bradycardia.UA negative for UTI Infection, chest x-ray negative for any pneumonia, ruling out infection. TSH 1.59.  Cortisol borderline low at 4.8. Patient is currently tolerating diet and has normal blood sugar readings. Recommend to obtain repeat cortisol level with ACTH stim test if still low. Reduced propranolol dose as this is relatively contraindicated in those prone to hypoglycemia Diabetic coordinator consult was obtained and patient was able to demonstrate  drawing up insulin from his syringes. Lantus dose was decreased to 5 units at bedtime (half of 10 units at bedtime of his outpatient home dose).   IDDM: Presenting with hypoglycemia, blood sugar readings in 30s at home. Continue sliding scale insulin, Lantus dose was decreased to 5 units at bedtime (half of 10 units at bedtime of his outpatient home dose). Hemoglobin A1c, C-peptide pending Diabetic coordinator consult was obtained and patient was able to demonstrate drawing up the insulin from his syringes. Per patient he had an appointment with endocrinology today, recommended shortly to see his PCP/endocrinologist later this week. He was also strongly recommended to keep a log of his blood sugar readings.    Chronic microcytic anemia, unclear etiology: Likely due to diabetes -Ferritin 323  Elevated AST:  Unclear etiology , follow-up patient  Wrist pain:  Stable. Continue percocet as needed for pain  Essential tremor:  Stable.  -Continue propranolol, however dose decreased due to hypoglycemia   Day of Discharge BP 108/67 mmHg  Pulse 76  Temp(Src) 98.2 F (36.8 C) (Oral)  Resp 15  Ht  (1.981 m)  Wt 90.538 kg (199 lb 9.6 oz)  BMI 23.07 kg/m2  SpO2 98%  Physical Exam: General: Alert and awake oriented x3 not in any acute distress. HEENT: anicteric sclera, pupils reactive to light and accommodation CVS: S1-S2 clear no murmur rubs or gallops Chest: clear to auscultation bilaterally, no wheezing rales or rhonchi Abdomen: soft nontender, nondistended, normal bowel sounds Extremities: no cyanosis, clubbing or edema noted bilaterally Neuro: Cranial nerves II-XII intact, no focal neurological deficits   The results of significant diagnostics from this hospitalization (including imaging, microbiology, ancillary and laboratory) are listed below for reference.    LAB RESULTS: Basic Metabolic Panel:  Recent Labs Lab 02/08/15 1425 02/09/15 0557  NA 137 142  K 4.2  4.3  CL 100* 110  CO2 28 26  GLUCOSE 69 150*  BUN 7 5*  CREATININE 0.91 0.81  CALCIUM 8.0* 7.5*   Liver Function Tests:  Recent Labs Lab 02/08/15 1425 02/09/15 0557  AST 42* 53*  ALT 26 25  ALKPHOS 122 95  BILITOT 1.0 0.5  PROT 4.7* 3.8*  ALBUMIN 1.9* 1.4*    Recent Labs Lab 02/08/15 1425  LIPASE 12   No results for input(s): AMMONIA in the last 168 hours. CBC:  Recent Labs Lab 02/08/15 1521 02/09/15 0557  WBC 4.1 6.0  NEUTROABS 1.9  --   HGB 10.2* 9.0*  HCT 30.8* 27.6*  MCV 66.7* 67.3*  PLT 238 208   Cardiac Enzymes:  Recent Labs Lab 02/08/15 1425  TROPONINI <0.03   BNP: Invalid input(s): POCBNP CBG:  Recent Labs Lab 02/09/15 0734 02/09/15 1117  GLUCAP 107* 108*    Significant Diagnostic Studies:  Dg Chest Portable 1 View  02/08/2015  CLINICAL DATA:  Hypoglycemia.  Diabetes. EXAM: PORTABLE CHEST 1 VIEW COMPARISON:  None. FINDINGS: The heart size  and mediastinal contours are within normal limits. Both lungs are clear. The visualized skeletal structures are unremarkable. IMPRESSION: No active disease. Electronically Signed   By: Signa Kellaylor  Stroud M.D.   On: 02/08/2015 14:43    2D ECHO:   Disposition and Follow-up: Discharge Instructions    Diet Carb Modified    Complete by:  As directed      Discharge instructions    Complete by:  As directed   Please continue Lantus 5 units at bed time (decreased from your home dose of 10 units). You can continue sliding scale insulin.  It is VERY IMPORTANT that you follow up with a PCP on a regular basis.  Check your blood glucoses before each meal and at bedtime and maintain a log of your readings.  Bring this log with you when you follow up with your PCP so that he or she can adjust your insulin at your follow up visit.  Also, please note propranolol dose was decreased to 40 mg twice a day for now due to your lower heart rate and BP. You can discuss with your primary care physician at the follow-up  appointment regarding any other changes to your propranolol dose.     Increase activity slowly    Complete by:  As directed             DISPOSITION: home    DISCHARGE FOLLOW-UP     Follow-up Information    Schedule an appointment as soon as possible for a visit in 10 days to follow up.   Why:  for hospital follow-up. Please take a log of your blood sugar readings to your PCP appointment.   Contact information:   Please follow with you endocrinologist or PCP within next 7-10 days.        Time spent on Discharge: 25 mins   Signed:   Shellee Streng M.D. [facility] 02/09/2015, 12:19 PM Pager: [phone]

## 2015-02-09 NOTE — Progress Notes (Signed)
Patient arrived on unit via stretcher with nurse tech. Patient alert and oriented x4. Patient oriented to staff, room, and unit. Patient placed on telemetry monitor, CCMD notified. Patient's IV clean, dry and intact. Skin assessment completed with two RNs, review flowsheets. Safety Fall Prevention Plan was given, discussed and signed by patient. Orders have been reviewed and implemented. Will continue to monitor the patient. Call light has been placed within reach.  Rivka BarbaraZenab Josi Roediger BSN, RN  Phone Number: [phone]

## 2015-02-09 NOTE — Progress Notes (Signed)
Pt being discharged home via wheelchair with friends. Pt alert and oriented x4. VSS. Pt c/o no pain at this time. No signs of respiratory distress. Education complete and care plans resolved. IV x2 removed with catheter intact and pt tolerated well. No further issues at this time. Pt to follow up with PCP. Jillyn HiddenStone,Selen Smucker R, RN

## 2015-02-09 NOTE — Progress Notes (Signed)
Spoke with patient in regards to his insulin doses and drawing up technique at home. Patient knows how to draw up his insulin from home with his current insulin syringes. Patient has speculated that his basal insulin doses needed to be decrease for the last few days but kept taking his 10 units as prescribed. I spoke with patient about his dose while inpatient and told him that the current MD will adjust his doses for home.   Thanks,  Christena DeemShannon Ellissa Ayo RN, MSN, [facility] Inpatient Diabetes Coordinator Team Pager [phone] (8a-5p)

## 2015-02-09 NOTE — Progress Notes (Signed)
CRITICAL VALUE ALERT  Critical value received:  Lactic Acid 2.8  Date of notification:  02/09/2015  Time of notification:  0707  Critical value read back:Yes.    Nurse who received alert:  Rivka BarbaraZenab Mahmoud, RN  MD notified (1st page):  Dr. Isidoro Donningai  Time of first page:  0707  MD notified (2nd page):  Time of second page:  Responding MD:  Dr. Isidoro Donningai   Time MD responded:  0730

## 2015-02-09 NOTE — Progress Notes (Signed)
CRITICAL LAB VALUE Lactic acid: 3.3 MD notified. RN will continue to monitor patient.  Veatrice KellsMahmoud,Otis Portal I, RN

## 2015-02-09 NOTE — Evaluation (Signed)
Physical Therapy Evaluation and Discharge Patient Details Name: Collin Goodwin MRN: [id number] DOB: [birth year] Today's Date: 02/09/2015   History of Present Illness  Jayvin Hurrell is a 59 y.o. male with a past medical history significant for severe pancreatitis 5 years ago resulting in DM who presents with hypothermia, hypoglycemia, brief confusion, bradycardia and lactic acidosis  Clinical Impression  Pt functioning at baseline. Pt lives in [place] but can stay with a friend here in [place] for as long as he needs. Recommend pt stay atleast one night as precautionary measures due to recent hypoglycemia episode since patient typically lives alone in [place]. Pt with no further acute PT needs at this time. Please re-consult if needed in future. PT SIGNING OFF.    Follow Up Recommendations No PT follow up;Supervision/Assistance - 24 hour (for first night for precautionary measures)    Equipment Recommendations  None recommended by PT    Recommendations for Other Services       Precautions / Restrictions Precautions Precautions: None      Mobility  Bed Mobility Overal bed mobility: Independent                Transfers Overall transfer level: Modified independent Equipment used: None             General transfer comment: pt with use of hands due to low surface height compared to patient height of 6'6"  Ambulation/Gait Ambulation/Gait assistance: Modified independent (Device/Increase time) Ambulation Distance (Feet): 200 Feet Assistive device: None Gait Pattern/deviations: WFL(Within Functional Limits) Gait velocity: wfl   General Gait Details: no episodes of LOB, baseline pigeon toed  Stairs            Wheelchair Mobility    Modified Rankin (Stroke Patients Only)       Balance Overall balance assessment: No apparent balance deficits (not formally assessed)                                           Pertinent Vitals/Pain Pain  Assessment: No/denies pain    Home Living Family/patient expects to be discharged to:: Private residence Living Arrangements: Alone (going to stay with friend in [place] for one night) Available Help at Discharge: Family;Friend(s);Available PRN/intermittently ([place] stay [place] friend in [place] forone night) Type of Home: House Home Access: Level entry     Home Layout: One level Home Equipment: None      Prior Function Level of Independence: Independent               Hand Dominance   Dominant Hand: Right (however in cast at this time, using Left)    Extremity/Trunk Assessment   Upper Extremity Assessment: RUE deficits/detail RUE Deficits / Details: in cast from previous thumb injury         Lower Extremity Assessment: Overall WFL for tasks assessed      Cervical / Trunk Assessment: Normal  Communication   Communication: No difficulties  Cognition Arousal/Alertness: Awake/alert Behavior During Therapy: WFL for tasks assessed/performed Overall Cognitive Status: Within Functional Limits for tasks assessed                      General Comments General comments (skin integrity, edema, etc.): pt functioning near baseline    Exercises        Assessment/Plan    PT Assessment Patent does not need any further PT services  PT Diagnosis  Generalized weakness   PT Problem List    PT Treatment Interventions     PT Goals (Current goals can be found in the Care Plan section) Acute Rehab PT Goals Patient Stated Goal: home asap PT Goal Formulation: All assessment and education complete, DC therapy    Frequency     Barriers to discharge        Co-evaluation               End of Session Equipment Utilized During Treatment: Gait belt Activity Tolerance: Patient tolerated treatment well Patient left: in bed;with call bell/phone within reach Nurse Communication: Mobility status    Functional Assessment Tool Used: clinical judgement Functional Limitation:  Mobility: Walking and moving around Mobility: Walking and Moving Around Current Status ([id number](G8978): At least 1 percent but less than 20 percent impaired, limited or restricted Mobility: Walking and Moving Around Goal Status [phone](G8979): At least 1 percent but less than 20 percent impaired, limited or restricted Mobility: Walking and Moving Around Discharge Status [phone](G8980): At least 1 percent but less than 20 percent impaired, limited or restricted    Time: 0730-0750 PT Time Calculation (min) (ACUTE ONLY): 20 min   Charges:   PT Evaluation $Initial PT Evaluation Tier I: 1 Procedure     PT G Codes:   PT G-Codes **NOT FOR INPATIENT CLASS** Functional Assessment Tool Used: clinical judgement Functional Limitation: Mobility: Walking and moving around Mobility: Walking and Moving Around Current Status ([id number](G8978): At least 1 percent but less than 20 percent impaired, limited or restricted Mobility: Walking and Moving Around Goal Status [phone](G8979): At least 1 percent but less than 20 percent impaired, limited or restricted Mobility: Walking and Moving Around Discharge Status [phone](G8980): At least 1 percent but less than 20 percent impaired, limited or restricted    Marcene BrawnChadwell, Jakiyah Stepney Marie 02/09/2015, 8:15 AM   Lewis ShockAshly Gemayel Mascio, PT, DPT Pager #: [phone] Office #: [phone]

## 2015-02-10 LAB — C-PEPTIDE: C-Peptide: 0.2 ng/mL — ABNORMAL LOW (ref 1.1–4.4)

## 2015-02-10 LAB — HEMOGLOBIN A1C
Hgb A1c MFr Bld: 10.4 % — ABNORMAL HIGH (ref 4.8–5.6)
MEAN PLASMA GLUCOSE: 252 mg/dL

## 2015-02-13 LAB — CULTURE, BLOOD (ROUTINE X 2)
Culture: NO GROWTH
Culture: NO GROWTH

## 2015-03-12 ENCOUNTER — Inpatient Hospital Stay (HOSPITAL_COMMUNITY): Payer: Medicare Other

## 2015-03-12 ENCOUNTER — Emergency Department (HOSPITAL_COMMUNITY): Payer: Medicare Other

## 2015-03-12 ENCOUNTER — Inpatient Hospital Stay (HOSPITAL_COMMUNITY)
Admission: EM | Admit: 2015-03-12 | Discharge: 2015-03-19 | DRG: 637 | Disposition: A | Discharge status: Skilled Nursing Facility | Payer: Medicare Other | Attending: Internal Medicine | Admitting: Internal Medicine

## 2015-03-12 ENCOUNTER — Encounter (HOSPITAL_COMMUNITY): Payer: Self-pay

## 2015-03-12 DIAGNOSIS — S2231XA Fracture of one rib, right side, initial encounter for closed fracture: Secondary | ICD-10-CM | POA: Diagnosis present

## 2015-03-12 DIAGNOSIS — Z720 Tobacco use: Secondary | ICD-10-CM | POA: Diagnosis present

## 2015-03-12 DIAGNOSIS — F329 Major depressive disorder, single episode, unspecified: Secondary | ICD-10-CM

## 2015-03-12 DIAGNOSIS — G9341 Metabolic encephalopathy: Secondary | ICD-10-CM | POA: Diagnosis present

## 2015-03-12 DIAGNOSIS — E860 Dehydration: Secondary | ICD-10-CM | POA: Diagnosis present

## 2015-03-12 DIAGNOSIS — I959 Hypotension, unspecified: Secondary | ICD-10-CM | POA: Diagnosis not present

## 2015-03-12 DIAGNOSIS — Z794 Long term (current) use of insulin: Secondary | ICD-10-CM

## 2015-03-12 DIAGNOSIS — E871 Hypo-osmolality and hyponatremia: Secondary | ICD-10-CM | POA: Diagnosis present

## 2015-03-12 DIAGNOSIS — M79671 Pain in right foot: Secondary | ICD-10-CM | POA: Diagnosis present

## 2015-03-12 DIAGNOSIS — N183 Chronic kidney disease, stage 3 (moderate): Secondary | ICD-10-CM | POA: Diagnosis present

## 2015-03-12 DIAGNOSIS — F17200 Nicotine dependence, unspecified, uncomplicated: Secondary | ICD-10-CM | POA: Diagnosis present

## 2015-03-12 DIAGNOSIS — F32A Depression, unspecified: Secondary | ICD-10-CM | POA: Diagnosis present

## 2015-03-12 DIAGNOSIS — R7401 Elevation of levels of liver transaminase levels: Secondary | ICD-10-CM

## 2015-03-12 DIAGNOSIS — G250 Essential tremor: Secondary | ICD-10-CM | POA: Diagnosis present

## 2015-03-12 DIAGNOSIS — R945 Abnormal results of liver function studies: Secondary | ICD-10-CM

## 2015-03-12 DIAGNOSIS — E872 Acidosis, unspecified: Secondary | ICD-10-CM

## 2015-03-12 DIAGNOSIS — D72829 Elevated white blood cell count, unspecified: Secondary | ICD-10-CM | POA: Diagnosis present

## 2015-03-12 DIAGNOSIS — R740 Nonspecific elevation of levels of transaminase and lactic acid dehydrogenase [LDH]: Secondary | ICD-10-CM

## 2015-03-12 DIAGNOSIS — R4182 Altered mental status, unspecified: Secondary | ICD-10-CM | POA: Diagnosis not present

## 2015-03-12 DIAGNOSIS — N179 Acute kidney failure, unspecified: Secondary | ICD-10-CM | POA: Diagnosis present

## 2015-03-12 DIAGNOSIS — IMO0001 Reserved for inherently not codable concepts without codable children: Secondary | ICD-10-CM

## 2015-03-12 DIAGNOSIS — R443 Hallucinations, unspecified: Secondary | ICD-10-CM | POA: Diagnosis present

## 2015-03-12 DIAGNOSIS — R739 Hyperglycemia, unspecified: Secondary | ICD-10-CM | POA: Diagnosis not present

## 2015-03-12 DIAGNOSIS — M79672 Pain in left foot: Secondary | ICD-10-CM | POA: Diagnosis present

## 2015-03-12 DIAGNOSIS — R7989 Other specified abnormal findings of blood chemistry: Secondary | ICD-10-CM

## 2015-03-12 DIAGNOSIS — E162 Hypoglycemia, unspecified: Secondary | ICD-10-CM

## 2015-03-12 DIAGNOSIS — K859 Acute pancreatitis without necrosis or infection, unspecified: Secondary | ICD-10-CM

## 2015-03-12 DIAGNOSIS — Z791 Long term (current) use of non-steroidal anti-inflammatories (NSAID): Secondary | ICD-10-CM | POA: Diagnosis not present

## 2015-03-12 DIAGNOSIS — E861 Hypovolemia: Secondary | ICD-10-CM | POA: Diagnosis present

## 2015-03-12 DIAGNOSIS — Y92410 Unspecified street and highway as the place of occurrence of the external cause: Secondary | ICD-10-CM | POA: Diagnosis not present

## 2015-03-12 DIAGNOSIS — A419 Sepsis, unspecified organism: Secondary | ICD-10-CM

## 2015-03-12 DIAGNOSIS — E870 Hyperosmolality and hypernatremia: Secondary | ICD-10-CM | POA: Diagnosis present

## 2015-03-12 DIAGNOSIS — Z79899 Other long term (current) drug therapy: Secondary | ICD-10-CM | POA: Diagnosis not present

## 2015-03-12 DIAGNOSIS — N289 Disorder of kidney and ureter, unspecified: Secondary | ICD-10-CM

## 2015-03-12 DIAGNOSIS — N189 Chronic kidney disease, unspecified: Secondary | ICD-10-CM

## 2015-03-12 DIAGNOSIS — I10 Essential (primary) hypertension: Secondary | ICD-10-CM | POA: Diagnosis not present

## 2015-03-12 DIAGNOSIS — R748 Abnormal levels of other serum enzymes: Secondary | ICD-10-CM | POA: Diagnosis not present

## 2015-03-12 DIAGNOSIS — R6510 Systemic inflammatory response syndrome (SIRS) of non-infectious origin without acute organ dysfunction: Secondary | ICD-10-CM | POA: Diagnosis present

## 2015-03-12 DIAGNOSIS — E119 Type 2 diabetes mellitus without complications: Secondary | ICD-10-CM

## 2015-03-12 DIAGNOSIS — R0781 Pleurodynia: Secondary | ICD-10-CM

## 2015-03-12 DIAGNOSIS — K861 Other chronic pancreatitis: Secondary | ICD-10-CM | POA: Diagnosis present

## 2015-03-12 DIAGNOSIS — E1310 Other specified diabetes mellitus with ketoacidosis without coma: Secondary | ICD-10-CM | POA: Diagnosis present

## 2015-03-12 DIAGNOSIS — R001 Bradycardia, unspecified: Secondary | ICD-10-CM

## 2015-03-12 DIAGNOSIS — I129 Hypertensive chronic kidney disease with stage 1 through stage 4 chronic kidney disease, or unspecified chronic kidney disease: Secondary | ICD-10-CM | POA: Diagnosis present

## 2015-03-12 LAB — BASIC METABOLIC PANEL
Anion gap: 10 (ref 5–15)
Anion gap: 9 (ref 5–15)
Anion gap: 6 (ref 5–15)
BUN: 20 mg/dL (ref 6–20)
BUN: 19 mg/dL (ref 6–20)
BUN: 19 mg/dL (ref 6–20)
CALCIUM: 6.6 mg/dL — AB (ref 8.9–10.3)
CHLORIDE: 105 mmol/L (ref 101–111)
CO2: 20 mmol/L — AB (ref 22–32)
CO2: 22 mmol/L (ref 22–32)
CO2: 25 mmol/L (ref 22–32)
CREATININE: 1.38 mg/dL — AB (ref 0.61–1.24)
CREATININE: 1.28 mg/dL — AB (ref 0.61–1.24)
CREATININE: 1.21 mg/dL (ref 0.61–1.24)
Calcium: 6.6 mg/dL — ABNORMAL LOW (ref 8.9–10.3)
Calcium: 6.7 mg/dL — ABNORMAL LOW (ref 8.9–10.3)
Chloride: 104 mmol/L (ref 101–111)
Chloride: 105 mmol/L (ref 101–111)
GFR calc Af Amer: >60 mL/min (ref >60)
GFR calc Af Amer: >60 mL/min (ref >60)
GFR calc Af Amer: >60 mL/min (ref >60)
GFR calc non Af Amer: >60 mL/min (ref >60)
GFR, EST NON AFRICAN AMERICAN: 54 mL/min — AB (ref >60)
GFR, EST NON AFRICAN AMERICAN: 59 mL/min — AB (ref >60)
GLUCOSE: 268 mg/dL — AB (ref 65–99)
GLUCOSE: 86 mg/dL (ref 65–99)
Glucose, Bld: 135 mg/dL — ABNORMAL HIGH (ref 65–99)
Potassium: 4.2 mmol/L (ref 3.5–5.1)
Potassium: 3.7 mmol/L (ref 3.5–5.1)
Potassium: 4 mmol/L (ref 3.5–5.1)
SODIUM: 136 mmol/L (ref 135–145)
SODIUM: 136 mmol/L (ref 135–145)
Sodium: 134 mmol/L — ABNORMAL LOW (ref 135–145)

## 2015-03-12 LAB — CBC WITH DIFFERENTIAL/PLATELET
Basophils Absolute: 0 10*3/uL (ref 0.0–0.1)
Basophils Relative: 0 %
Eosinophils Absolute: 0 10*3/uL (ref 0.0–0.7)
Eosinophils Relative: 0 %
HCT: 34.4 % — ABNORMAL LOW (ref 39.0–52.0)
Hemoglobin: 12.2 g/dL — ABNORMAL LOW (ref 13.0–17.0)
Lymphocytes Relative: 7 %
Lymphs Abs: 1.3 10*3/uL (ref 0.7–4.0)
MCH: 23.4 pg — ABNORMAL LOW (ref 26.0–34.0)
MCHC: 35.5 g/dL (ref 30.0–36.0)
MCV: 66 fL — ABNORMAL LOW (ref 78.0–100.0)
Monocytes Absolute: 0.7 10*3/uL (ref 0.1–1.0)
Monocytes Relative: 4 %
Neutro Abs: 16.7 10*3/uL — ABNORMAL HIGH (ref 1.7–7.7)
Neutrophils Relative %: 89 %
Platelets: UNDETERMINED 10*3/uL (ref 150–400)
RBC: 5.21 MIL/uL (ref 4.22–5.81)
RDW: 16.3 % — ABNORMAL HIGH (ref 11.5–15.5)
WBC: 18.7 10*3/uL — ABNORMAL HIGH (ref 4.0–10.5)

## 2015-03-12 LAB — COMPREHENSIVE METABOLIC PANEL
ALT: 44 U/L (ref 17–63)
AST: 42 U/L — ABNORMAL HIGH (ref 15–41)
Albumin: 2 g/dL — ABNORMAL LOW (ref 3.5–5.0)
Alkaline Phosphatase: 166 U/L — ABNORMAL HIGH (ref 38–126)
Anion gap: 13 (ref 5–15)
BUN: 19 mg/dL (ref 6–20)
CO2: 26 mmol/L (ref 22–32)
Calcium: 7.7 mg/dL — ABNORMAL LOW (ref 8.9–10.3)
Chloride: 91 mmol/L — ABNORMAL LOW (ref 101–111)
Creatinine, Ser: 1.92 mg/dL — ABNORMAL HIGH (ref 0.61–1.24)
GFR calc Af Amer: 42 mL/min — ABNORMAL LOW (ref >60)
GFR calc non Af Amer: 36 mL/min — ABNORMAL LOW (ref >60)
Glucose, Bld: 518 mg/dL — ABNORMAL HIGH (ref 65–99)
Potassium: 5 mmol/L (ref 3.5–5.1)
Sodium: 130 mmol/L — ABNORMAL LOW (ref 135–145)
Total Bilirubin: 1.3 mg/dL — ABNORMAL HIGH (ref 0.3–1.2)
Total Protein: 5.8 g/dL — ABNORMAL LOW (ref 6.5–8.1)

## 2015-03-12 LAB — I-STAT TROPONIN, ED: Troponin i, poc: 0 ng/mL (ref 0.00–0.08)

## 2015-03-12 LAB — TROPONIN I: Troponin I: 0.07 ng/mL — ABNORMAL HIGH (ref <0.031)

## 2015-03-12 LAB — URINALYSIS, ROUTINE W REFLEX MICROSCOPIC
Glucose, UA: >1000 mg/dL — AB
Hgb urine dipstick: NEGATIVE
Ketones, ur: 15 mg/dL — AB
Leukocytes, UA: NEGATIVE
Nitrite: NEGATIVE
Protein, ur: NEGATIVE mg/dL
Specific Gravity, Urine: 1.028 (ref 1.005–1.030)
pH: 5 (ref 5.0–8.0)

## 2015-03-12 LAB — CBG MONITORING, ED
GLUCOSE-CAPILLARY: 489 mg/dL — AB (ref 65–99)
GLUCOSE-CAPILLARY: 401 mg/dL — AB (ref 65–99)
GLUCOSE-CAPILLARY: 414 mg/dL — AB (ref 65–99)
GLUCOSE-CAPILLARY: 282 mg/dL — AB (ref 65–99)
GLUCOSE-CAPILLARY: 260 mg/dL — AB (ref 65–99)

## 2015-03-12 LAB — MRSA PCR SCREENING: MRSA BY PCR: NEGATIVE

## 2015-03-12 LAB — VITAMIN B12: Vitamin B-12: 1868 pg/mL — ABNORMAL HIGH (ref 180–914)

## 2015-03-12 LAB — URINE MICROSCOPIC-ADD ON

## 2015-03-12 LAB — TSH: TSH: 1.087 u[IU]/mL (ref 0.350–4.500)

## 2015-03-12 LAB — I-STAT CG4 LACTIC ACID, ED
LACTIC ACID, VENOUS: 5.13 mmol/L — AB (ref 0.5–2.0)
LACTIC ACID, VENOUS: 1.68 mmol/L (ref 0.5–2.0)

## 2015-03-12 LAB — CK: Total CK: 361 U/L (ref 49–397)

## 2015-03-12 LAB — GLUCOSE, CAPILLARY
GLUCOSE-CAPILLARY: 142 mg/dL — AB (ref 65–99)
GLUCOSE-CAPILLARY: 169 mg/dL — AB (ref 65–99)
Glucose-Capillary: 116 mg/dL — ABNORMAL HIGH (ref 65–99)
Glucose-Capillary: 147 mg/dL — ABNORMAL HIGH (ref 65–99)

## 2015-03-12 LAB — MAGNESIUM: MAGNESIUM: 1.5 mg/dL — AB (ref 1.7–2.4)

## 2015-03-12 MED ORDER — SODIUM CHLORIDE 0.9 % IV SOLN
INTRAVENOUS | Status: DC
  Administered 2015-03-12: 22:00:00 via INTRAVENOUS

## 2015-03-12 MED ORDER — SODIUM CHLORIDE 0.9 % IV SOLN: 1000.0000 mL | INTRAVENOUS | Status: DC

## 2015-03-12 MED ORDER — SODIUM CHLORIDE 0.9 % IV BOLUS (SEPSIS)
1000.0000 mL | Freq: Once | INTRAVENOUS | Status: AC
  Administered 2015-03-12: 1000 mL via INTRAVENOUS

## 2015-03-12 MED ORDER — ACETAMINOPHEN 325 MG PO TABS
650.0000 mg | ORAL_TABLET | Freq: Once | ORAL | Status: AC
  Administered 2015-03-12: 650 mg via ORAL
  Filled 2015-03-12: qty 2

## 2015-03-12 MED ORDER — ACETAMINOPHEN 325 MG PO TABS: 325.0000 mg | ORAL_TABLET | Freq: Once | ORAL | Status: DC

## 2015-03-12 MED ORDER — SODIUM CHLORIDE 0.9 % IV SOLN
INTRAVENOUS | Status: DC
  Administered 2015-03-12: 125 mL/h via INTRAVENOUS

## 2015-03-12 MED ORDER — OXYCODONE-ACETAMINOPHEN 5-325 MG PO TABS
1.0000 | ORAL_TABLET | Freq: Four times a day (QID) | ORAL | Status: DC | PRN
Start: 2015-03-12 — End: 2015-03-19
  Administered 2015-03-12 – 2015-03-18 (×12): 2 via ORAL
  Administered 2015-03-18: 1 via ORAL
  Administered 2015-03-18: 2 via ORAL
  Administered 2015-03-19: 1 via ORAL
  Filled 2015-03-12 (×2): qty 2
  Filled 2015-03-12: qty 1
  Filled 2015-03-12 (×4): qty 2
  Filled 2015-03-12: qty 1
  Filled 2015-03-12 (×8): qty 2

## 2015-03-12 MED ORDER — DEXTROSE 50 % IV SOLN
25.0000 mL | INTRAVENOUS | Status: DC | PRN
  Administered 2015-03-14 – 2015-03-18 (×3): 25 mL via INTRAVENOUS
  Filled 2015-03-12 (×3): qty 50

## 2015-03-12 MED ORDER — ONDANSETRON HCL 4 MG/2ML IJ SOLN
4.0000 mg | Freq: Four times a day (QID) | INTRAMUSCULAR | Status: DC | PRN
  Administered 2015-03-16: 4 mg via INTRAVENOUS
  Filled 2015-03-12: qty 2

## 2015-03-12 MED ORDER — DEXTROSE-NACL 5-0.45 % IV SOLN: INTRAVENOUS | Status: DC

## 2015-03-12 MED ORDER — CITALOPRAM HYDROBROMIDE 20 MG PO TABS
20.0000 mg | ORAL_TABLET | Freq: Every day | ORAL | Status: DC
  Administered 2015-03-12 – 2015-03-19 (×8): 20 mg via ORAL
  Filled 2015-03-12 (×8): qty 1

## 2015-03-12 MED ORDER — NICOTINE 14 MG/24HR TD PT24
14.0000 mg | MEDICATED_PATCH | Freq: Every day | TRANSDERMAL | Status: DC
  Administered 2015-03-12 – 2015-03-19 (×8): 14 mg via TRANSDERMAL
  Filled 2015-03-12 (×8): qty 1

## 2015-03-12 MED ORDER — VANCOMYCIN HCL 10 G IV SOLR
1500.0000 mg | INTRAVENOUS | Status: AC
  Administered 2015-03-12: 1500 mg via INTRAVENOUS
  Filled 2015-03-12: qty 1500

## 2015-03-12 MED ORDER — INSULIN REGULAR BOLUS VIA INFUSION
0.0000 [IU] | Freq: Three times a day (TID) | INTRAVENOUS | Status: DC
  Filled 2015-03-12: qty 10

## 2015-03-12 MED ORDER — DEXTROSE 50 % IV SOLN: 25.0000 mL | INTRAVENOUS | Status: DC | PRN

## 2015-03-12 MED ORDER — INSULIN ASPART 100 UNIT/ML ~~LOC~~ SOLN
6.0000 [IU] | Freq: Once | SUBCUTANEOUS | Status: AC
  Administered 2015-03-12: 6 [IU] via SUBCUTANEOUS
  Filled 2015-03-12: qty 1

## 2015-03-12 MED ORDER — SODIUM CHLORIDE 0.9 % IV SOLN
INTRAVENOUS | Status: DC
  Administered 2015-03-12: 3.9 [IU]/h via INTRAVENOUS
  Filled 2015-03-12: qty 2.5

## 2015-03-12 MED ORDER — VANCOMYCIN HCL IN DEXTROSE 1-5 GM/200ML-% IV SOLN: 1000.0000 mg | Freq: Once | INTRAVENOUS | Status: DC

## 2015-03-12 MED ORDER — SODIUM CHLORIDE 0.9 % IV BOLUS (SEPSIS)
1000.0000 mL | INTRAVENOUS | Status: AC
  Administered 2015-03-12: 1000 mL via INTRAVENOUS

## 2015-03-12 MED ORDER — PIPERACILLIN-TAZOBACTAM 3.375 G IVPB
3.3750 g | Freq: Three times a day (TID) | INTRAVENOUS | Status: DC
  Administered 2015-03-12 – 2015-03-16 (×11): 3.375 g via INTRAVENOUS
  Filled 2015-03-12 (×14): qty 50

## 2015-03-12 MED ORDER — AMITRIPTYLINE HCL 50 MG PO TABS
50.0000 mg | ORAL_TABLET | Freq: Every day | ORAL | Status: DC
  Administered 2015-03-12 – 2015-03-17 (×6): 50 mg via ORAL
  Filled 2015-03-12 (×8): qty 1

## 2015-03-12 MED ORDER — SODIUM CHLORIDE 0.9 % IV SOLN
1000.0000 mL | INTRAVENOUS | Status: DC
  Administered 2015-03-12 – 2015-03-14 (×4): 1000 mL via INTRAVENOUS

## 2015-03-12 MED ORDER — SODIUM CHLORIDE 0.9 % IV BOLUS (SEPSIS)
500.0000 mL | INTRAVENOUS | Status: AC
  Administered 2015-03-12: 500 mL via INTRAVENOUS

## 2015-03-12 MED ORDER — SODIUM CHLORIDE 0.9 % IV BOLUS (SEPSIS)
500.0000 mL | Freq: Once | INTRAVENOUS | Status: AC
  Administered 2015-03-12: 500 mL via INTRAVENOUS

## 2015-03-12 MED ORDER — PIPERACILLIN-TAZOBACTAM 3.375 G IVPB 30 MIN
3.3750 g | Freq: Once | INTRAVENOUS | Status: AC
  Administered 2015-03-12: 3.375 g via INTRAVENOUS
  Filled 2015-03-12: qty 50

## 2015-03-12 MED ORDER — PANCRELIPASE (LIP-PROT-AMYL) 12000-38000 UNITS PO CPEP
12000.0000 [IU] | ORAL_CAPSULE | Freq: Three times a day (TID) | ORAL | Status: DC
  Administered 2015-03-12 – 2015-03-17 (×14): 12000 [IU] via ORAL
  Filled 2015-03-12 (×14): qty 1

## 2015-03-12 MED ORDER — VANCOMYCIN HCL IN DEXTROSE 750-5 MG/150ML-% IV SOLN
750.0000 mg | Freq: Two times a day (BID) | INTRAVENOUS | Status: DC
  Administered 2015-03-12: 750 mg via INTRAVENOUS
  Filled 2015-03-12 (×2): qty 150

## 2015-03-12 MED ORDER — SODIUM CHLORIDE 0.9 % IV SOLN
INTRAVENOUS | Status: DC
  Filled 2015-03-12: qty 2.5

## 2015-03-12 MED ORDER — ONDANSETRON HCL 4 MG PO TABS: 4.0000 mg | ORAL_TABLET | Freq: Four times a day (QID) | ORAL | Status: DC | PRN

## 2015-03-12 MED ORDER — OXYCODONE-ACETAMINOPHEN 5-325 MG PO TABS: 2.0000 | ORAL_TABLET | Freq: Once | ORAL | Status: AC

## 2015-03-12 MED ORDER — ACETAMINOPHEN 500 MG PO TABS
500.0000 mg | ORAL_TABLET | Freq: Four times a day (QID) | ORAL | Status: DC | PRN
  Administered 2015-03-12: 500 mg via ORAL
  Filled 2015-03-12: qty 1

## 2015-03-12 NOTE — ED Notes (Signed)
IV team at bedside 

## 2015-03-12 NOTE — ED Notes (Signed)
CBG  401 

## 2015-03-12 NOTE — ED Notes (Signed)
ACTIVATED CODE SEPSIS  
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
## 2015-03-12 NOTE — ED Notes (Signed)
Lab continues at bedside attempting cultures.

## 2015-03-12 NOTE — ED Notes (Signed)
Attempted report 

## 2015-03-12 NOTE — ED Notes (Signed)
Kirinchenko, PA at bedside
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
## 2015-03-12 NOTE — ED Notes (Signed)
[id number] 

## 2015-03-12 NOTE — ED Notes (Signed)
Tatyana, PA notified of abnormal lab test results 
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
## 2015-03-12 NOTE — ED Notes (Signed)
CBG 489 

## 2015-03-12 NOTE — ED Notes (Addendum)
Gwenlyn PerkingMadera, MD verbal order to not start D5 & 0.45%, Gwenlyn PerkingMadera, MD gives verbal order that once the pt has x3 CBGs <200 to stop the glucosestablizer, this RN requesting to have BMP lab order placed to monitor K+, will place as one time telephonic order with readback for BMP to be drawn

## 2015-03-12 NOTE — ED Notes (Signed)
Phlebotomy at bedside attempting to collect Istat lactic
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
## 2015-03-12 NOTE — ED Notes (Signed)
Pt given urinal for sample 

## 2015-03-12 NOTE — ED Notes (Signed)
Gwenlyn PerkingMadera, MD paged re: pts declining BP, Schlossman, MD EDP aware & verbal order to admin 1L NS bolus

## 2015-03-12 NOTE — ED Notes (Signed)
resp non labored. Minimal snoring

## 2015-03-12 NOTE — ED Notes (Signed)
Phlebotomy at bedside attempting to draw labs, this RN unable to draw labs with IV start

## 2015-03-13 LAB — RPR: RPR Ser Ql: NONREACTIVE

## 2015-03-13 LAB — CBC
HEMATOCRIT: 26.4 % — AB (ref 39.0–52.0)
HEMOGLOBIN: 8.9 g/dL — AB (ref 13.0–17.0)
MCH: 22.7 pg — AB (ref 26.0–34.0)
MCHC: 33.7 g/dL (ref 30.0–36.0)
MCV: 67.3 fL — AB (ref 78.0–100.0)
PLATELETS: 146 10*3/uL — AB (ref 150–400)
RBC: 3.92 MIL/uL — AB (ref 4.22–5.81)
RDW: 16.6 % — ABNORMAL HIGH (ref 11.5–15.5)
WBC: 14.7 10*3/uL — AB (ref 4.0–10.5)

## 2015-03-13 LAB — HEMOGLOBIN A1C
HEMOGLOBIN A1C: 10.3 % — AB (ref 4.8–5.6)
MEAN PLASMA GLUCOSE: 249 mg/dL

## 2015-03-13 LAB — HIV ANTIBODY (ROUTINE TESTING W REFLEX): HIV Screen 4th Generation wRfx: NONREACTIVE

## 2015-03-13 LAB — TROPONIN I: Troponin I: 0.03 ng/mL (ref <0.031)

## 2015-03-13 LAB — BASIC METABOLIC PANEL
ANION GAP: 6 (ref 5–15)
BUN: 18 mg/dL (ref 6–20)
CHLORIDE: 104 mmol/L (ref 101–111)
CO2: 27 mmol/L (ref 22–32)
CREATININE: 1.1 mg/dL (ref 0.61–1.24)
Calcium: 7 mg/dL — ABNORMAL LOW (ref 8.9–10.3)
GFR calc non Af Amer: >60 mL/min (ref >60)
Glucose, Bld: 144 mg/dL — ABNORMAL HIGH (ref 65–99)
Potassium: 3.6 mmol/L (ref 3.5–5.1)
SODIUM: 137 mmol/L (ref 135–145)

## 2015-03-13 LAB — URINE CULTURE: Culture: NO GROWTH

## 2015-03-13 LAB — CORTISOL: CORTISOL PLASMA: 14.6 ug/dL

## 2015-03-13 LAB — GLUCOSE, CAPILLARY
GLUCOSE-CAPILLARY: 189 mg/dL — AB (ref 65–99)
Glucose-Capillary: 119 mg/dL — ABNORMAL HIGH (ref 65–99)
Glucose-Capillary: 123 mg/dL — ABNORMAL HIGH (ref 65–99)
Glucose-Capillary: 162 mg/dL — ABNORMAL HIGH (ref 65–99)
Glucose-Capillary: 81 mg/dL (ref 65–99)

## 2015-03-13 LAB — LACTIC ACID, PLASMA: Lactic Acid, Venous: 1.5 mmol/L (ref 0.5–2.0)

## 2015-03-13 MED ORDER — INSULIN ASPART 100 UNIT/ML ~~LOC~~ SOLN
0.0000 [IU] | Freq: Three times a day (TID) | SUBCUTANEOUS | Status: DC
  Administered 2015-03-13: 3 [IU] via SUBCUTANEOUS
  Administered 2015-03-13 – 2015-03-15 (×2): 2 [IU] via SUBCUTANEOUS
  Administered 2015-03-16: 3 [IU] via SUBCUTANEOUS

## 2015-03-13 MED ORDER — INSULIN GLARGINE 100 UNIT/ML ~~LOC~~ SOLN
10.0000 [IU] | Freq: Every day | SUBCUTANEOUS | Status: DC
  Administered 2015-03-13 – 2015-03-17 (×4): 10 [IU] via SUBCUTANEOUS
  Filled 2015-03-13 (×6): qty 0.1

## 2015-03-13 MED ORDER — VANCOMYCIN HCL IN DEXTROSE 1-5 GM/200ML-% IV SOLN
1000.0000 mg | Freq: Two times a day (BID) | INTRAVENOUS | Status: DC
  Administered 2015-03-13 – 2015-03-15 (×6): 1000 mg via INTRAVENOUS
  Filled 2015-03-13 (×8): qty 200

## 2015-03-13 MED ORDER — KETOROLAC TROMETHAMINE 30 MG/ML IJ SOLN
30.0000 mg | Freq: Three times a day (TID) | INTRAMUSCULAR | Status: DC | PRN
  Administered 2015-03-13 – 2015-03-14 (×3): 30 mg via INTRAVENOUS
  Filled 2015-03-13 (×3): qty 1

## 2015-03-13 MED ORDER — INSULIN GLARGINE 100 UNIT/ML ~~LOC~~ SOLN
5.0000 [IU] | Freq: Every day | SUBCUTANEOUS | Status: DC
  Administered 2015-03-13: 5 [IU] via SUBCUTANEOUS
  Filled 2015-03-13 (×4): qty 0.05
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
## 2015-03-13 NOTE — Progress Notes (Signed)
Inpatient Diabetes Program Recommendations  AACE/ADA: New Consensus Statement on Inpatient Glycemic Control (2015)  Target Ranges:  Prepandial:   less than 140 mg/dL      Peak postprandial:   less than 180 mg/dL (1-2 hours)      Critically ill patients:  140 - 180 mg/dL   Review of Glycemic Control  Diabetes history: DM 2 Outpatient Diabetes medications: Lantus 5 units, Apidra 11 units breakfast - 12 units lunch - 9 units super Current orders for Inpatient glycemic control: Lantus 10, Novolog Moderate TID  Inpatient Diabetes Program Recommendations: Insulin - Basal: Patient received 5 units when transitioning off IV insulin last night. Fasting glucose 123 mg/dl this am. Patient takes Lantus 5 units at home. Please consider adjusting basal insulin to Lantus 5 units QHS.  Thanks,  Christena DeemShannon Raji Glinski RN, MSN, [facility] Inpatient Diabetes Coordinator Team Pager [phone] (8a-5p)

## 2015-03-13 NOTE — Progress Notes (Signed)
Utilization review completed. Tonette Koehne, RN, BSN. 

## 2015-03-14 LAB — CBC
HCT: 26 % — ABNORMAL LOW (ref 39.0–52.0)
Hemoglobin: 8.7 g/dL — ABNORMAL LOW (ref 13.0–17.0)
MCH: 22.8 pg — ABNORMAL LOW (ref 26.0–34.0)
MCHC: 33.5 g/dL (ref 30.0–36.0)
MCV: 68.1 fL — AB (ref 78.0–100.0)
PLATELETS: 124 10*3/uL — AB (ref 150–400)
RBC: 3.82 MIL/uL — ABNORMAL LOW (ref 4.22–5.81)
RDW: 17.1 % — AB (ref 11.5–15.5)
WBC: 9.2 10*3/uL (ref 4.0–10.5)

## 2015-03-14 LAB — URIC ACID: URIC ACID, SERUM: 4.4 mg/dL (ref 4.4–7.6)

## 2015-03-14 LAB — GLUCOSE, CAPILLARY
GLUCOSE-CAPILLARY: 65 mg/dL (ref 65–99)
Glucose-Capillary: 95 mg/dL (ref 65–99)
Glucose-Capillary: 104 mg/dL — ABNORMAL HIGH (ref 65–99)
Glucose-Capillary: 137 mg/dL — ABNORMAL HIGH (ref 65–99)
Glucose-Capillary: 144 mg/dL — ABNORMAL HIGH (ref 65–99)

## 2015-03-14 LAB — BASIC METABOLIC PANEL
Anion gap: 7 (ref 5–15)
BUN: 20 mg/dL (ref 6–20)
CALCIUM: 6.9 mg/dL — AB (ref 8.9–10.3)
CHLORIDE: 106 mmol/L (ref 101–111)
CO2: 22 mmol/L (ref 22–32)
CREATININE: 1.06 mg/dL (ref 0.61–1.24)
Glucose, Bld: 86 mg/dL (ref 65–99)
Potassium: 3.8 mmol/L (ref 3.5–5.1)
SODIUM: 135 mmol/L (ref 135–145)

## 2015-03-14 MED ORDER — ALUM & MAG HYDROXIDE-SIMETH 200-200-20 MG/5ML PO SUSP
30.0000 mL | Freq: Once | ORAL | Status: AC
  Administered 2015-03-14: 30 mL via ORAL
  Filled 2015-03-14: qty 30

## 2015-03-14 MED ORDER — FUROSEMIDE 10 MG/ML IJ SOLN
40.0000 mg | Freq: Once | INTRAMUSCULAR | Status: AC
  Administered 2015-03-14: 40 mg via INTRAVENOUS
  Filled 2015-03-14: qty 4
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
## 2015-03-15 ENCOUNTER — Inpatient Hospital Stay (HOSPITAL_COMMUNITY): Payer: Medicare Other

## 2015-03-15 DIAGNOSIS — I10 Essential (primary) hypertension: Secondary | ICD-10-CM

## 2015-03-15 DIAGNOSIS — F329 Major depressive disorder, single episode, unspecified: Secondary | ICD-10-CM

## 2015-03-15 DIAGNOSIS — N189 Chronic kidney disease, unspecified: Secondary | ICD-10-CM

## 2015-03-15 DIAGNOSIS — N179 Acute kidney failure, unspecified: Secondary | ICD-10-CM

## 2015-03-15 DIAGNOSIS — R4182 Altered mental status, unspecified: Secondary | ICD-10-CM

## 2015-03-15 DIAGNOSIS — R739 Hyperglycemia, unspecified: Secondary | ICD-10-CM

## 2015-03-15 DIAGNOSIS — E871 Hypo-osmolality and hyponatremia: Secondary | ICD-10-CM

## 2015-03-15 LAB — GLUCOSE, CAPILLARY
GLUCOSE-CAPILLARY: 107 mg/dL — AB (ref 65–99)
GLUCOSE-CAPILLARY: 143 mg/dL — AB (ref 65–99)
GLUCOSE-CAPILLARY: 85 mg/dL (ref 65–99)
Glucose-Capillary: 94 mg/dL (ref 65–99)

## 2015-03-15 LAB — COMPREHENSIVE METABOLIC PANEL
ALBUMIN: 1.2 g/dL — AB (ref 3.5–5.0)
ALK PHOS: 328 U/L — AB (ref 38–126)
ALT: 90 U/L — AB (ref 17–63)
ANION GAP: 7 (ref 5–15)
AST: 284 U/L — AB (ref 15–41)
BUN: 15 mg/dL (ref 6–20)
CO2: 27 mmol/L (ref 22–32)
Calcium: 7.5 mg/dL — ABNORMAL LOW (ref 8.9–10.3)
Chloride: 103 mmol/L (ref 101–111)
Creatinine, Ser: 1.16 mg/dL (ref 0.61–1.24)
GFR calc Af Amer: >60 mL/min (ref >60)
GFR calc non Af Amer: >60 mL/min (ref >60)
GLUCOSE: 143 mg/dL — AB (ref 65–99)
Potassium: 3.5 mmol/L (ref 3.5–5.1)
SODIUM: 137 mmol/L (ref 135–145)
Total Bilirubin: 2.5 mg/dL — ABNORMAL HIGH (ref 0.3–1.2)
Total Protein: 4.3 g/dL — ABNORMAL LOW (ref 6.5–8.1)

## 2015-03-15 LAB — CBC
HEMATOCRIT: 28.8 % — AB (ref 39.0–52.0)
HEMOGLOBIN: 9.3 g/dL — AB (ref 13.0–17.0)
MCH: 22.1 pg — ABNORMAL LOW (ref 26.0–34.0)
MCHC: 32.3 g/dL (ref 30.0–36.0)
MCV: 68.6 fL — ABNORMAL LOW (ref 78.0–100.0)
Platelets: 154 10*3/uL (ref 150–400)
RBC: 4.2 MIL/uL — AB (ref 4.22–5.81)
RDW: 17.3 % — ABNORMAL HIGH (ref 11.5–15.5)
WBC: 7.6 10*3/uL (ref 4.0–10.5)

## 2015-03-15 MED ORDER — ALUM & MAG HYDROXIDE-SIMETH 200-200-20 MG/5ML PO SUSP
30.0000 mL | Freq: Once | ORAL | Status: AC
  Administered 2015-03-15: 30 mL via ORAL
  Filled 2015-03-15: qty 30

## 2015-03-15 MED ORDER — HYDROMORPHONE HCL 1 MG/ML IJ SOLN
0.5000 mg | INTRAMUSCULAR | Status: DC | PRN
  Administered 2015-03-15 – 2015-03-16 (×4): 0.5 mg via INTRAVENOUS
  Filled 2015-03-15 (×4): qty 1

## 2015-03-15 NOTE — Care Management Important Message (Signed)
Important Message  Patient Details  Name: Collin Goodwin MRN: [id number] Date of Birth: Sep 12, 1955   Medicare Important Message Given:  Yes    Kyla BalzarineShealy, Jeb Schloemer Abena 03/15/2015, 11:18 AM

## 2015-03-15 NOTE — Progress Notes (Signed)
Pt. Arrived to the unit via wheelchair. Pt. Is alert and oriented with no signs of distress noted. Pt. Vitals appear stable with no skin issues noted. Pt. Ambulated from wheelchair to the bed with walker and tolerated well. Educated pt. On use of staff numbers, room telephone and call bell. Call light within reach. Orders released. No further needs noted at this time.
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
## 2015-03-15 NOTE — Progress Notes (Signed)
OT Cancellation Note  Patient Details Name: Collin Goodwin [place] MRN: [id number] DOB: Mar 15, 1956   Cancelled Treatment:    Reason Eval/Treat Not Completed: Other (comment) Pt is Medicare  and current D/C plan is SNF. No apparent immediate acute care OT needs, therefore will defer OT to SNF. If OT eval is needed please call Acute Rehab Dept. at [phone] or text page OT at [phone].    Evette GeorgesLeonard, Amari Zagal Eva [phone] 03/15/2015, 4:24 PM

## 2015-03-16 ENCOUNTER — Inpatient Hospital Stay (HOSPITAL_COMMUNITY): Payer: Medicare Other

## 2015-03-16 LAB — GLUCOSE, CAPILLARY
GLUCOSE-CAPILLARY: 97 mg/dL (ref 65–99)
Glucose-Capillary: 113 mg/dL — ABNORMAL HIGH (ref 65–99)
Glucose-Capillary: 180 mg/dL — ABNORMAL HIGH (ref 65–99)
Glucose-Capillary: 89 mg/dL (ref 65–99)

## 2015-03-16 LAB — CBC
HEMATOCRIT: 30.1 % — AB (ref 39.0–52.0)
HEMOGLOBIN: 10.2 g/dL — AB (ref 13.0–17.0)
MCH: 23.1 pg — ABNORMAL LOW (ref 26.0–34.0)
MCHC: 33.9 g/dL (ref 30.0–36.0)
MCV: 68.3 fL — ABNORMAL LOW (ref 78.0–100.0)
Platelets: 214 10*3/uL (ref 150–400)
RBC: 4.41 MIL/uL (ref 4.22–5.81)
RDW: 17.6 % — ABNORMAL HIGH (ref 11.5–15.5)
WBC: 8.5 10*3/uL (ref 4.0–10.5)

## 2015-03-16 LAB — BASIC METABOLIC PANEL
ANION GAP: 7 (ref 5–15)
BUN: 9 mg/dL (ref 6–20)
CALCIUM: 7.9 mg/dL — AB (ref 8.9–10.3)
CO2: 27 mmol/L (ref 22–32)
Chloride: 102 mmol/L (ref 101–111)
Creatinine, Ser: 0.82 mg/dL (ref 0.61–1.24)
GLUCOSE: 87 mg/dL (ref 65–99)
POTASSIUM: 3.6 mmol/L (ref 3.5–5.1)
Sodium: 136 mmol/L (ref 135–145)

## 2015-03-16 LAB — HEPATIC FUNCTION PANEL
ALBUMIN: 1.5 g/dL — AB (ref 3.5–5.0)
ALK PHOS: 637 U/L — AB (ref 38–126)
ALT: 120 U/L — ABNORMAL HIGH (ref 17–63)
AST: 339 U/L — ABNORMAL HIGH (ref 15–41)
Bilirubin, Direct: 2.4 mg/dL — ABNORMAL HIGH (ref 0.1–0.5)
Indirect Bilirubin: 0.9 mg/dL (ref 0.3–0.9)
TOTAL PROTEIN: 4.8 g/dL — AB (ref 6.5–8.1)
Total Bilirubin: 3.3 mg/dL — ABNORMAL HIGH (ref 0.3–1.2)

## 2015-03-16 LAB — GAMMA GT: GGT: 580 U/L — ABNORMAL HIGH (ref 7–50)

## 2015-03-16 LAB — LIPASE, BLOOD: Lipase: 12 U/L (ref 11–51)

## 2015-03-16 MED ORDER — IOHEXOL 300 MG/ML  SOLN
100.0000 mL | Freq: Once | INTRAMUSCULAR | Status: AC | PRN
Start: 2015-03-16 — End: 2015-03-16
  Administered 2015-03-16: 100 mL via INTRAVENOUS

## 2015-03-16 MED ORDER — AMOXICILLIN-POT CLAVULANATE 875-125 MG PO TABS
1.0000 | ORAL_TABLET | Freq: Two times a day (BID) | ORAL | Status: DC
  Administered 2015-03-16 – 2015-03-18 (×5): 1 via ORAL
  Filled 2015-03-16 (×5): qty 1

## 2015-03-16 MED ORDER — HYDROMORPHONE HCL 1 MG/ML IJ SOLN
1.0000 mg | INTRAMUSCULAR | Status: DC | PRN
  Administered 2015-03-16 – 2015-03-18 (×7): 1 mg via INTRAVENOUS
  Filled 2015-03-16 (×8): qty 1

## 2015-03-16 MED ORDER — BARIUM SULFATE 2.1 % PO SUSP
ORAL | Status: AC
  Filled 2015-03-16: qty 2

## 2015-03-16 MED ORDER — BARIUM SULFATE 2.1 % PO SUSP
450.0000 mL | Freq: Two times a day (BID) | ORAL | Status: DC
  Administered 2015-03-16: 450 mL via ORAL

## 2015-03-16 MED ORDER — PANTOPRAZOLE SODIUM 40 MG IV SOLR
40.0000 mg | Freq: Two times a day (BID) | INTRAVENOUS | Status: DC
  Administered 2015-03-16 – 2015-03-17 (×4): 40 mg via INTRAVENOUS
  Filled 2015-03-16 (×4): qty 40

## 2015-03-16 MED ORDER — SODIUM CHLORIDE 0.9 % IV SOLN
INTRAVENOUS | Status: DC
  Administered 2015-03-16 – 2015-03-17 (×2): via INTRAVENOUS
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
## 2015-03-16 NOTE — Progress Notes (Signed)
PT Cancellation Note  Patient Details Name: Collin LightningCecil Goodwin MRN: [id number] DOB: February 01, 1956   Cancelled Treatment:    Reason Eval/Treat Not Completed: Other (comment) Patient with nausea and going for CT. Nurse holding off on PO meds at this time. Patient declining PT. WIll follow up as appropriate   Envy Meno, Adline PotterJulia Elizabeth 03/16/2015, 9:54 AM

## 2015-03-17 ENCOUNTER — Encounter (HOSPITAL_COMMUNITY): Payer: Self-pay | Admitting: Gastroenterology

## 2015-03-17 LAB — COMPREHENSIVE METABOLIC PANEL
ALBUMIN: 1.3 g/dL — AB (ref 3.5–5.0)
ALK PHOS: 675 U/L — AB (ref 38–126)
ALT: 90 U/L — ABNORMAL HIGH (ref 17–63)
AST: 167 U/L — AB (ref 15–41)
Anion gap: 3 — ABNORMAL LOW (ref 5–15)
BILIRUBIN TOTAL: 1.6 mg/dL — AB (ref 0.3–1.2)
CALCIUM: 7.6 mg/dL — AB (ref 8.9–10.3)
CO2: 27 mmol/L (ref 22–32)
CREATININE: 0.75 mg/dL (ref 0.61–1.24)
Chloride: 104 mmol/L (ref 101–111)
GFR calc Af Amer: >60 mL/min (ref >60)
GLUCOSE: 129 mg/dL — AB (ref 65–99)
Potassium: 3.7 mmol/L (ref 3.5–5.1)
Sodium: 134 mmol/L — ABNORMAL LOW (ref 135–145)
TOTAL PROTEIN: 4 g/dL — AB (ref 6.5–8.1)

## 2015-03-17 LAB — CBC
HEMATOCRIT: 25 % — AB (ref 39.0–52.0)
HEMOGLOBIN: 8.3 g/dL — AB (ref 13.0–17.0)
MCH: 22.9 pg — ABNORMAL LOW (ref 26.0–34.0)
MCHC: 33.2 g/dL (ref 30.0–36.0)
MCV: 69.1 fL — AB (ref 78.0–100.0)
Platelets: 251 10*3/uL (ref 150–400)
RBC: 3.62 MIL/uL — ABNORMAL LOW (ref 4.22–5.81)
RDW: 17.8 % — ABNORMAL HIGH (ref 11.5–15.5)
WBC: 7.7 10*3/uL (ref 4.0–10.5)

## 2015-03-17 LAB — GLUCOSE, CAPILLARY
GLUCOSE-CAPILLARY: 117 mg/dL — AB (ref 65–99)
Glucose-Capillary: 90 mg/dL (ref 65–99)
Glucose-Capillary: 84 mg/dL (ref 65–99)
Glucose-Capillary: 96 mg/dL (ref 65–99)

## 2015-03-17 LAB — CULTURE, BLOOD (ROUTINE X 2)
CULTURE: NO GROWTH
Culture: NO GROWTH

## 2015-03-17 LAB — HEPATITIS PANEL, ACUTE
HEP B S AG: NEGATIVE
Hep A IgM: NEGATIVE
Hep B C IgM: NEGATIVE

## 2015-03-17 LAB — ANTI-SMOOTH MUSCLE ANTIBODY, IGG: F-ACTIN AB IGG: 11 U (ref 0–19)

## 2015-03-17 MED ORDER — PROPRANOLOL HCL 40 MG PO TABS
40.0000 mg | ORAL_TABLET | Freq: Two times a day (BID) | ORAL | Status: DC
  Administered 2015-03-17 – 2015-03-18 (×4): 40 mg via ORAL
  Filled 2015-03-17 (×6): qty 1

## 2015-03-17 MED ORDER — GI COCKTAIL ~~LOC~~: 30.0000 mL | Freq: Two times a day (BID) | ORAL | Status: DC | PRN

## 2015-03-17 MED ORDER — PANCRELIPASE (LIP-PROT-AMYL) 12000-38000 UNITS PO CPEP
24000.0000 [IU] | ORAL_CAPSULE | Freq: Three times a day (TID) | ORAL | Status: DC
  Administered 2015-03-17 – 2015-03-19 (×5): 24000 [IU] via ORAL
  Filled 2015-03-17 (×5): qty 2
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
## 2015-03-17 NOTE — [place] FL2 (Signed)
[place] MEDICAID FL2 LEVEL OF CARE SCREENING TOOL     IDENTIFICATION  Patient Name: Collin Goodwin Birthdate: [birth year] Sex: male Admission Date (Current Location): 03/12/2015  [place] and [place] Number: [facility] and Address:  [facility], [address] [place], [place] [address]      Provider Number: [id number]  Attending Physician Name and Address:  Cathren Harsh, MD  Relative Name and Phone Number:  N/A (Pt is from [facility].)    Current Level of Care: Hospital Recommended Level of Care: Skilled Nursing Facility Prior Approval Number:    Date Approved/Denied:   PASRR Number: [id number]   Discharge Plan: SNF    Current Diagnoses: Patient Active Problem List   Diagnosis Date Noted  . Sepsis (HCC) 03/12/2015  . Hyperglycemia 03/12/2015  . Altered mental status 03/12/2015  . Leukocytosis 03/12/2015  . Dehydration with hyponatremia 03/12/2015  . Acute kidney injury superimposed on chronic kidney disease (HCC) 03/12/2015  . Benign essential HTN 03/12/2015  . Depression 03/12/2015  . Tobacco abuse 03/12/2015  . Lactic acidosis 02/08/2015  . IDDM (insulin dependent diabetes mellitus) (HCC) 02/08/2015  . Essential tremor 02/08/2015  . Hypoglycemia 02/08/2015  . Bradycardia 02/08/2015    Orientation ACTIVITIES/SOCIAL BLADDER RESPIRATION    Self, Time, Situation, Place  Active Continent Normal  BEHAVIORAL SYMPTOMS/MOOD NEUROLOGICAL BOWEL NUTRITION STATUS      Continent  (Carb modified. See DC Summary.)  PHYSICIAN VISITS COMMUNICATION OF NEEDS Height & Weight Skin    Verbally  (198.1 cm) 178 lbs. Normal          AMBULATORY STATUS RESPIRATION    Assist extensive Normal      Personal Care Assistance Level of Assistance  Bathing, Feeding, Dressing Bathing Assistance: Limited assistance Feeding assistance: Independent Dressing Assistance: Limited assistance      Functional Limitations Info                SPECIAL CARE FACTORS FREQUENCY  PT (By licensed PT)     PT Frequency: 5x/week             Additional Factors Info  Insulin Sliding Scale Code Status Info: Full Allergies Info: NKA   Insulin Sliding Scale Info: insulin aspart (novoLOG) injection 0-15 Units; insulin glargine (LANTUS) injection 10 Units       Current Medications (03/17/2015):  This is the current hospital active medication list Current Facility-Administered Medications  Medication Dose Route Frequency Provider Last Rate Last Dose  . acetaminophen (TYLENOL) tablet 500 mg  500 mg Oral Q6H PRN Vassie Loll, MD   500 mg at 03/12/15 2144  . amitriptyline (ELAVIL) tablet 50 mg  50 mg Oral QHS Vassie Loll, MD   50 mg at 03/16/15 2128  . amoxicillin-clavulanate (AUGMENTIN) 875-125 MG per tablet 1 tablet  1 tablet Oral Q12H Ripudeep Jenna Luo, MD   1 tablet at 03/17/15 1045  . citalopram (CELEXA) tablet 20 mg  20 mg Oral Daily Vassie Loll, MD   20 mg at 03/17/15 1045  . dextrose 50 % solution 25 mL  25 mL Intravenous PRN Vassie Loll, MD   25 mL at 03/14/15 6063  . gi cocktail (Maalox,Lidocaine,Donnatal)  30 mL Oral BID PRN Ripudeep K Rai, MD      . HYDROmorphone (DILAUDID) injection 1 mg  1 mg Intravenous Q4H PRN Ripudeep K Rai, MD   1 mg at 03/17/15 1046  . insulin aspart (novoLOG) injection 0-15 Units  0-15 Units Subcutaneous TID WC  Leanne ChangKatherine P Schorr, NP   3 Units at 03/16/15 1700  . insulin glargine (LANTUS) injection 10 Units  10 Units Subcutaneous QHS Vassie Lollarlos Madera, MD   10 Units at 03/16/15 2200  . lipase/protease/amylase (CREON) capsule 12,000 Units  12,000 Units Oral TID Vassie Lollarlos Madera, MD   12,000 Units at 03/17/15 1045  . nicotine (NICODERM CQ - dosed in mg/24 hours) patch 14 mg  14 mg Transdermal Daily Vassie Lollarlos Madera, MD   14 mg at 03/17/15 1047  . ondansetron (ZOFRAN) tablet 4 mg  4 mg Oral Q6H PRN Vassie Lollarlos Madera, MD       Or  . ondansetron [facility](ZOFRAN) injection 4 mg  4 mg Intravenous Q6H PRN Vassie Lollarlos Madera, MD    4 mg at 03/16/15 0253  . oxyCODONE-acetaminophen (PERCOCET/ROXICET) 5-325 MG per tablet 1-2 tablet  1-2 tablet Oral Q6H PRN Leanne ChangKatherine P Schorr, NP   2 tablet at 03/17/15 0741  . pantoprazole (PROTONIX) injection 40 mg  40 mg Intravenous Q12H Ripudeep K Rai, MD   40 mg at 03/17/15 1046  . propranolol (INDERAL) tablet 40 mg  40 mg Oral BID Ripudeep Jenna LuoK Rai, MD         Discharge Medications: Please see discharge summary for a list of discharge medications.  Relevant Imaging Results:  Relevant Lab Results:  Recent Labs    Additional Information    Mearl Latinadia S Noach Calvillo, LCSWA

## 2015-03-17 NOTE — Clinical Social Work Note (Signed)
Clinical Social Work Assessment  Patient Details  Name: Collin Goodwin MRN: [id number] Date of Birth: 10/08/1955  Date of referral:  03/17/15               Reason for consult:  Facility Placement                Permission sought to share information with:  Chartered certified accountant granted to share information::  Yes, Verbal Permission Granted  Name::     N/A  Agency::  [facility] SNF's  Relationship::  N/A  Contact Information:     Housing/Transportation Living arrangements for the past 2 months:  [place] of Information:  Patient Patient Interpreter Needed:  None Criminal Activity/Legal Involvement Pertinent to Current Situation/Hospitalization:  No - Comment as needed Significant Relationships:  Parents, Friend Lives with:  Friends Do you feel safe going back to the place where you live?  No Need for family participation in patient care:  No (Coment) (Patient is from [place] but wants a SNF in [place] giving concerns:  CSW received referral for possible SNF placement at time of discharge. CSW met with patient regarding PT recommendation of SNF placement at time of discharge. Per patient's, patient currently is staying w/ a friend is from [place]. Pt is currently unable to care for himself at his friend's home given patient's current physical needs and fall risk. Patient expressed understanding of PT recommendation and is agreeable to SNF placement at time of discharge. CSW to continue to follow and assist with discharge planning needs.   Social Worker assessment / plan:  CSW spoke with patient concerning possibility of rehab at [facility] before returning home.  Employment status:  Retired Forensic scientist:  Medicare PT Recommendations:  [place] / Referral to community resources:  [place]  Patient/Family's Response to care:  Patient recognizes need for rehab before returning  home and is agreeable to a SNF in [place].   Patient/Family's Understanding of and Emotional Response to Diagnosis, Current Treatment, and Prognosis:  Patient is realistic regarding therapy needs. No questions/concerns about plan or treatment.    Emotional Assessment Appearance:  Appears stated age Attitude/Demeanor/Rapport:    Affect (typically observed):  Accepting, Appropriate Orientation:  Oriented to Self, Oriented to Place, Oriented to  Time, Oriented to Situation Alcohol / Substance use:  Tobacco Use Psych involvement (Current and /or in the community):  No (Comment)  Discharge Needs  Concerns to be addressed:  Care Coordination Readmission within the last 30 days:  No Current discharge risk:  None Barriers to Discharge:  Continued Medical Work up   [employer], [place] 03/17/2015, 5:43 PM

## 2015-03-17 NOTE — Progress Notes (Signed)
   03/17/15 2015  What Happened  Was fall witnessed? No  Was patient injured? No  Patient found on floor (Sitting on the floor)  Found by Staff-comment (Raybon Conard Estate agentJeter RN)  Stated prior activity bathroom-unassisted  Follow Up  MD notified Schorr, NP  Time MD notified 2022  Family notified No- patient refusal  Adult Fall Risk Assessment  Risk Factor Category (scoring not indicated) Fall has occurred during this admission (document High fall risk)  Patient's Fall Risk High Fall Risk (>13 points)  Adult Fall Risk Interventions  Required Bundle Interventions *See Row Information* High fall risk - low, moderate, and high requirements implemented  Additional Interventions Fall risk signage;Individualized elimination schedule;Secure all tubes/drains;Use of appropriate toileting equipment (bedpan, BSC, etc.)  Fall with Injury Screening  Risk For Fall Injury- See Row Information  Nurse judgement  Intervention(s) for 2 or more risk criteria identified Gait Belt  Pain Assessment  Pain Assessment 0-10  Pain Score 10  Pain Type Acute pain  Pain Location Rib cage  Pain Orientation Right  Pain Descriptors / Indicators Aching  Pain Frequency Constant  Pain Onset Gradual  Patients Stated Pain Goal 5  Pain Intervention(s) RN made aware  Neurological  Neuro (WDL) WDL  Level of Consciousness Alert  Orientation Level Oriented X4  Cognition Follows commands  Speech Clear  Facial Symmetry Symmetrical  R Hand Grip Present;Moderate  L Hand Grip Present;Moderate   Neuro Additional Assessments No  Seizure Activity  Psychomotor Symptoms None  Musculoskeletal  Musculoskeletal (WDL) X  Assistive Device None  Generalized Weakness Yes  Weight Bearing Restrictions Yes  RUE Weight Bearing NWB  Integumentary  Integumentary (WDL) WDL  Skin Color Appropriate for ethnicity  Skin Condition Dry  Skin Integrity Intact  Skin Turgor Non-tenting
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
## 2015-03-18 ENCOUNTER — Inpatient Hospital Stay (HOSPITAL_COMMUNITY): Payer: Medicare Other

## 2015-03-18 LAB — COMPREHENSIVE METABOLIC PANEL
ALK PHOS: 973 U/L — AB (ref 38–126)
ALT: 91 U/L — ABNORMAL HIGH (ref 17–63)
ANION GAP: 5 (ref 5–15)
AST: 134 U/L — ABNORMAL HIGH (ref 15–41)
Albumin: 1.4 g/dL — ABNORMAL LOW (ref 3.5–5.0)
BILIRUBIN TOTAL: 1.5 mg/dL — AB (ref 0.3–1.2)
BUN: <5 mg/dL — ABNORMAL LOW (ref 6–20)
CALCIUM: 8.2 mg/dL — AB (ref 8.9–10.3)
CO2: 28 mmol/L (ref 22–32)
Chloride: 107 mmol/L (ref 101–111)
Creatinine, Ser: 0.61 mg/dL (ref 0.61–1.24)
GLUCOSE: 24 mg/dL — AB (ref 65–99)
Potassium: 3.6 mmol/L (ref 3.5–5.1)
Sodium: 140 mmol/L (ref 135–145)
TOTAL PROTEIN: 4.8 g/dL — AB (ref 6.5–8.1)

## 2015-03-18 LAB — CBC
HEMATOCRIT: 29.9 % — AB (ref 39.0–52.0)
Hemoglobin: 9.7 g/dL — ABNORMAL LOW (ref 13.0–17.0)
MCH: 22.4 pg — AB (ref 26.0–34.0)
MCHC: 32.4 g/dL (ref 30.0–36.0)
MCV: 69.1 fL — ABNORMAL LOW (ref 78.0–100.0)
Platelets: 380 10*3/uL (ref 150–400)
RBC: 4.33 MIL/uL (ref 4.22–5.81)
RDW: 18 % — ABNORMAL HIGH (ref 11.5–15.5)
WBC: 7.3 10*3/uL (ref 4.0–10.5)

## 2015-03-18 LAB — NUCLEOTIDASE, 5', BLOOD: 5 NUCLEOTIDASE: 16 IU/L — AB (ref 0–10)

## 2015-03-18 LAB — GLUCOSE, CAPILLARY
GLUCOSE-CAPILLARY: 80 mg/dL (ref 65–99)
GLUCOSE-CAPILLARY: 71 mg/dL (ref 65–99)
GLUCOSE-CAPILLARY: 61 mg/dL — AB (ref 65–99)
GLUCOSE-CAPILLARY: 113 mg/dL — AB (ref 65–99)
Glucose-Capillary: <10 mg/dL — CL (ref 65–99)
Glucose-Capillary: 97 mg/dL (ref 65–99)

## 2015-03-18 MED ORDER — PANTOPRAZOLE SODIUM 40 MG PO TBEC
40.0000 mg | DELAYED_RELEASE_TABLET | Freq: Two times a day (BID) | ORAL | Status: DC
  Administered 2015-03-18 – 2015-03-19 (×3): 40 mg via ORAL
  Filled 2015-03-18 (×3): qty 1

## 2015-03-18 MED ORDER — INSULIN ASPART 100 UNIT/ML ~~LOC~~ SOLN: 0.0000 [IU] | Freq: Every day | SUBCUTANEOUS | Status: DC

## 2015-03-18 MED ORDER — GADOBENATE DIMEGLUMINE 529 MG/ML IV SOLN
15.0000 mL | Freq: Once | INTRAVENOUS | Status: AC | PRN
  Administered 2015-03-18: 15 mL via INTRAVENOUS

## 2015-03-18 MED ORDER — INSULIN ASPART 100 UNIT/ML ~~LOC~~ SOLN
0.0000 [IU] | Freq: Three times a day (TID) | SUBCUTANEOUS | Status: DC
  Administered 2015-03-19: 3 [IU] via SUBCUTANEOUS

## 2015-03-18 NOTE — Progress Notes (Signed)
Collin Goodwin 11:21 AM  Subjective: Patient doing better today and his case discussed with the hospital team and is tolerating regular diet and has no new complaints  Objective: Vital signs stable afebrile no acute distress abdomen is a little softer less tender. Good Bowel sounds alkaline phosphatase increased a little other labs okay  Assessment: Elevated alkaline phosphatase questionable etiology  Plan: Will proceed with MRCP just to be sure and continue to follow and we discussed stopping all unnecessary medicine particularly the new ones like antibiotics if not needed for other reasons  [facility]ary[facility]OD,Aalaysia Liggins E  Pager [phone] After 5PM or if no answer call [phone]
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
## 2015-03-18 NOTE — Progress Notes (Addendum)
CBG 59, notified Dr. Isidoro Donningai. Patient asymptomatic.
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
## 2015-03-18 NOTE — Clinical Social Work Placement (Signed)
   CLINICAL SOCIAL WORK PLACEMENT  NOTE  Date:  03/18/2015  Patient Details  Name: Collin Goodwin MRN: [id number] Date of Birth: June 21, 1955  Clinical Social Work is seeking post-discharge placement for this patient at the Skilled  Nursing Facility level of care (*CSW will initial, date and re-position this form in  chart as items are completed):  Yes   Patient/family provided with [facility] Clinical Social Work Department's list of facilities offering this level of care within the geographic area requested by the patient (or if unable, by the patient's family).  Yes   Patient/family informed of their freedom to choose among providers that offer the needed level of care, that participate in Medicare, Medicaid or managed care program needed by the patient, have an available bed and are willing to accept the patient.  Yes   Patient/family informed of [facility] ownership interest in [facility] and [facility], as well as of the fact that they are under no obligation to receive care at these facilities.  PASRR submitted to EDS on 03/17/15     PASRR number received on 03/17/15     Existing PASRR number confirmed on       FL2 transmitted to all facilities in geographic area requested by pt/family on       FL2 transmitted to all facilities within larger geographic area on       Patient informed that his/her managed care company has contracts with or will negotiate with certain facilities, including the following:        Yes   Patient/family informed of bed offers received.  [facility] bed [facility]     Physician recommends and patient chooses bed at      Patient to be transferred [facility] on 03/18/15.  Patient to be transferred to facility by [facility]     Patient family notified on 03/18/15 of transfer.  Name of family member notified:  N/A     PHYSICIAN Please prepare priority discharge summary, including medications     Additional Comment:     _______________________________________________ Mearl LatinNadia S Olesya Wike, LCSWA 03/18/2015, 1:49 PM

## 2015-03-18 NOTE — Progress Notes (Signed)
CRITICAL VALUE ALERT  Critical value received:  Glucose 24  Date of notification: 03/18/2015 Time of notification:  719  Critical value read back:Yes.    Nurse who received alert:  [place] Shamell Suarez Rn  MD notified (1st page): RAI, R Time of first page:  800  50 % dextrose given, rechecked CBG 15 mins later. CBG 80. Made day RN Judeth Cornfield(Stephanie RN) aware

## 2015-03-19 LAB — COMPREHENSIVE METABOLIC PANEL
ALBUMIN: 1.3 g/dL — AB (ref 3.5–5.0)
ALK PHOS: 986 U/L — AB (ref 38–126)
ALT: 75 U/L — AB (ref 17–63)
ANION GAP: 10 (ref 5–15)
AST: 98 U/L — AB (ref 15–41)
BUN: 6 mg/dL (ref 6–20)
CALCIUM: 8.1 mg/dL — AB (ref 8.9–10.3)
CO2: 22 mmol/L (ref 22–32)
Chloride: 106 mmol/L (ref 101–111)
Creatinine, Ser: 0.76 mg/dL (ref 0.61–1.24)
GFR calc Af Amer: >60 mL/min (ref >60)
GFR calc non Af Amer: >60 mL/min (ref >60)
GLUCOSE: 106 mg/dL — AB (ref 65–99)
Potassium: 4.3 mmol/L (ref 3.5–5.1)
SODIUM: 138 mmol/L (ref 135–145)
Total Bilirubin: 1.4 mg/dL — ABNORMAL HIGH (ref 0.3–1.2)
Total Protein: 4.3 g/dL — ABNORMAL LOW (ref 6.5–8.1)

## 2015-03-19 LAB — GLUCOSE, CAPILLARY
Glucose-Capillary: 75 mg/dL (ref 65–99)
Glucose-Capillary: 117 mg/dL — ABNORMAL HIGH (ref 65–99)
Glucose-Capillary: 204 mg/dL — ABNORMAL HIGH (ref 65–99)

## 2015-03-19 MED ORDER — SODIUM CHLORIDE 0.9 % IV BOLUS (SEPSIS)
500.0000 mL | Freq: Once | INTRAVENOUS | Status: AC
  Administered 2015-03-19: 500 mL via INTRAVENOUS

## 2015-03-19 MED ORDER — INSULIN ASPART 100 UNIT/ML ~~LOC~~ SOLN: 0.0000 [IU] | Freq: Three times a day (TID) | SUBCUTANEOUS | Status: AC

## 2015-03-19 MED ORDER — OXYCODONE-ACETAMINOPHEN 5-325 MG PO TABS: 1.0000 | ORAL_TABLET | Freq: Four times a day (QID) | ORAL | Status: AC | PRN

## 2015-03-19 MED ORDER — PROPRANOLOL HCL 40 MG PO TABS: 40.0000 mg | ORAL_TABLET | Freq: Every day | ORAL | Status: AC

## 2015-03-19 MED ORDER — PANCRELIPASE (LIP-PROT-AMYL) 24000-76000 UNITS PO CPEP: 24000.0000 [IU] | ORAL_CAPSULE | Freq: Three times a day (TID) | ORAL | Status: AC

## 2015-03-19 MED ORDER — PROPRANOLOL HCL 40 MG PO TABS: 40.0000 mg | ORAL_TABLET | Freq: Every day | ORAL | Status: DC

## 2015-03-19 MED ORDER — PANTOPRAZOLE SODIUM 40 MG PO TBEC: 40.0000 mg | DELAYED_RELEASE_TABLET | Freq: Two times a day (BID) | ORAL | Status: AC

## 2015-03-19 MED ORDER — NICOTINE 14 MG/24HR TD PT24: 14.0000 mg | MEDICATED_PATCH | Freq: Every day | TRANSDERMAL | Status: AC

## 2015-03-19 NOTE — Clinical Social Work Placement (Signed)
   CLINICAL SOCIAL WORK PLACEMENT  NOTE  Date:  03/19/2015  Patient Details  Name: Collin Goodwin MRN: [id number] Date of Birth: 1956/02/06  Clinical Social Work is seeking post-discharge placement for this patient at the Skilled  Nursing Facility level of care (*CSW will initial, date and re-position this form in  chart as items are completed):  Yes   Patient/family provided with [facility] Department's list of facilities offering this level of care within the geographic area requested by the patient (or if unable, by the patient's family).  Yes   Patient/family informed of their freedom to choose among providers that offer the needed level of care, that participate in Medicare, Medicaid or managed care program needed by the patient, have an available bed and are willing to accept the patient.  Yes   Patient/family informed of [facility]'s ownership interest in [facility] and [facility], as well as of the fact that they are under no obligation to receive care at these facilities.  PASRR submitted to EDS on 03/17/15     PASRR number received on 03/17/15     Existing PASRR number confirmed on       FL2 transmitted to all facilities in geographic area requested by pt/family on       FL2 transmitted to all facilities within larger geographic area on       Patient informed that his/her managed care company has contracts with or will negotiate with certain facilities, including the following:        Yes   Patient/family informed of bed offers received.  Patient [facility] bed at [facility]     Physician recommends and patient chooses bed at      Patient to be transferred to [facility] on 03/18/15.  Patient to be transferred to facility by [facility]     Patient family notified on 03/18/15 of transfer.  Name of family member notified:  N/A     PHYSICIAN Please prepare prescriptions     Additional Comment:     _______________________________________________ Mearl LatinNadia S Yarethzi Branan, LCSWA 03/19/2015, 12:53 PM

## 2015-03-19 NOTE — Progress Notes (Signed)
NURSING PROGRESS NOTE  Collin Goodwin [id number] Discharge Data: 03/19/2015 2:52 PM Attending Provider: Cathren Harshipudeep K Rai, MD WUJ:[id number]:PROVIDER NOT IN SYSTEM     Collin Goodwin to be D/C'd Skilled nursing facility per MD order.  Discussed with the patient the After Visit Summary and all questions fully answered. All IV's discontinued with no bleeding noted. All belongings returned to patient for patient to take home.   Last Vital Signs:  Blood pressure 96/54, pulse 75, temperature 97.5 F (36.4 C), temperature source Oral, resp. rate 15, height 6\' 6"  (1.981 m), weight 80.74 kg (178 lb), SpO2 100 %.  Discharge Medication List   Medication List    STOP taking these medications        amitriptyline 50 MG tablet  Commonly known as:  ELAVIL     APIDRA 100 UNIT/ML injection  Generic drug:  insulin glulisine     ibuprofen 200 MG tablet  Commonly known as:  ADVIL,MOTRIN     insulin glargine 100 UNIT/ML injection  Commonly known as:  LANTUS     spironolactone 50 MG tablet  Commonly known as:  ALDACTONE      TAKE these medications        acetaminophen 500 MG tablet  Commonly known as:  TYLENOL  Take 500 mg by mouth at bedtime as needed for mild pain or headache.     citalopram 20 MG tablet  Commonly known as:  CELEXA  Take 20 mg by mouth daily.     GOLD BOND MEDICATED ANTI ITCH 1-1 % Crea  Generic drug:  Pramoxine-Menthol  Apply 1 application topically daily.     hydrocortisone cream 1 %  Apply 1 application topically daily.     insulin aspart 100 UNIT/ML injection  Commonly known as:  novoLOG  Inject 0-9 Units into the skin 3 (three) times daily with meals. Sliding scale CBG 70 - 120: 0 units CBG 121 - 150: 1 unit,  CBG 151 - 200: 2 units,  CBG 201 - 250: 3 units,  CBG 251 - 300: 5 units,  CBG 301 - 350: 7 units,  CBG 351 - 400: 9 units   CBG > 400: 9 units and notify your MD     nicotine 14 mg/24hr patch  Commonly known as:  NICODERM CQ - dosed in mg/24 hours  Place 1 patch (14  mg total) onto the skin daily.     oxyCODONE-acetaminophen 5-325 MG tablet  Commonly known as:  PERCOCET/ROXICET  Take 1-2 tablets by mouth every 6 (six) hours as needed for severe pain.     Pancrelipase (Lip-Prot-Amyl) 24000 UNITS Cpep  Take 1 capsule (24,000 Units total) by mouth 3 (three) times daily.     pantoprazole 40 MG tablet  Commonly known as:  PROTONIX  Take 1 tablet (40 mg total) by mouth 2 (two) times daily.     propranolol 40 MG tablet  Commonly known as:  INDERAL  Take 1 tablet (40 mg total) by mouth daily.  Start taking on:  03/20/2015     ROLAIDS PO  Take 2 tablets by mouth as needed (for heartburn).     Vitamin D (Ergocalciferol) 50000 UNITS Caps capsule  Commonly known as:  DRISDOL  Take 50,000 Units by mouth. Monday and Thursday         Roma KayserStephanie Quentin Strebel, RN

## 2015-03-19 NOTE — Care Management Note (Signed)
Case Management Note  Patient Details  Name: Collin Goodwin MRN: [id number] Date of Birth: 06/06/1955  Subjective/Objective:      Patient is for dc to snf today, CSW following.              Action/Plan:   Expected Discharge Date:                  Expected Discharge Plan:  Skilled Nursing Facility  In-House Referral:  Clinical Social Work  Discharge planning Services  CM Consult  Post Acute Care Choice:    Choice offered to:     DME Arranged:    DME Agency:     HH Arranged:    HH Agency:     Status of Service:  Completed, signed off  Medicare Important Message Given:  Yes Date Medicare IM Given:    Medicare IM give by:    Date Additional Medicare IM Given:    Additional Medicare Important Message give by:     If discussed at Long Length of Stay Meetings, dates discussed:    Additional Comments:  Collin Goodwin, Collin Hintze Clinton, RN 03/19/2015, 1:37 PM

## 2015-03-19 NOTE — Care Management Note (Signed)
Case Management Note  Patient Details  Name: Collin Goodwin MRN: [id number] Date of Birth: Mar 02, 1956  Subjective/Objective:   Patient is for [facility], CSW following.                 Action/Plan:   Expected Discharge Date:                  Expected Discharge Plan:  Skilled Nursing Facility  In-House Referral:  Clinical Social Work  Discharge planning Services  CM Consult  Post Acute Care Choice:    Choice offered to:     DME Arranged:    DME Agency:     HH Arranged:    HH Agency:     Status of Service:  Completed, signed off  Medicare Important Message Given:  Yes Date Medicare IM Given:    Medicare IM give by:    Date Additional Medicare IM Given:    Additional Medicare Important Message give by:     If discussed at Long Length of Stay Meetings, dates discussed:    Additional Comments:  Leone Havenaylor, Faiz Weber Clinton, RN 03/19/2015, 2:30 PM
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
## 2015-03-19 NOTE — Progress Notes (Signed)
Collin Goodwin 10:26 AM  Subjective: Patient's complaining more about his ribs today although he does have some indigestion and he again confirms he had an endoscopy and colonoscopy this year in [place] and we could certainly request those records to be faxed if needed and he is eating okay however and no new complaints Objective: Vital signs stable afebrile no acute distress today he is more tender on his ribs his abdomen seems to be less tender alkaline phosphatase unchanged MRCP not revealing not great quality  Assessment: Increased alkaline phosphatase questionable etiology  Plan: Might want a fractionated alkaline phosphatase just to be sure it is not coming from his ribs and he will need to be followed as an outpatient for this which could be done in [place] and will check on tomorrow  [facility] Center[facility],Collin Goodwin  Pager [phone] After 5PM or if no answer call [phone]

## 2015-03-19 NOTE — Progress Notes (Signed)
[facility], was placed on hold for the nurse and no one returned to the call.

## 2015-03-19 NOTE — Progress Notes (Addendum)
Notified Dr Isidoro Donningai of BP 82/52. New orders placed.  1240: Notified Dr Isidoro Donningai of BP 96/54 after receiving bolus.

## 2015-03-19 NOTE — Progress Notes (Signed)
[facility] will [facility]: [facility] Anticipated DC date: 03/19/15 Family notified: N/A Patient alert and oriented Transport by: [facility]  CSW signing off.  Cristobal GoldmannNadia Valerye Kobus, [place] Clinical Social Worker [phone]

## 2015-03-19 NOTE — Care Management Important Message (Signed)
Important Message  Patient Details  Name: Collin Goodwin MRN: [id number] Date of Birth: Aug 29, 1955   Medicare Important Message Given:  Yes    Kyla BalzarineShealy, Eldean Nanna Abena 03/19/2015, 1:56 PM

## 2015-03-24 ENCOUNTER — Non-Acute Institutional Stay (SKILLED_NURSING_FACILITY): Payer: Medicare Other | Admitting: Internal Medicine

## 2015-03-24 DIAGNOSIS — L89152 Pressure ulcer of sacral region, stage 2: Secondary | ICD-10-CM | POA: Diagnosis not present

## 2015-03-24 DIAGNOSIS — K861 Other chronic pancreatitis: Secondary | ICD-10-CM | POA: Diagnosis not present

## 2015-03-24 DIAGNOSIS — F17200 Nicotine dependence, unspecified, uncomplicated: Secondary | ICD-10-CM | POA: Diagnosis not present

## 2015-03-24 DIAGNOSIS — N189 Chronic kidney disease, unspecified: Secondary | ICD-10-CM

## 2015-03-24 DIAGNOSIS — L98429 Non-pressure chronic ulcer of back with unspecified severity: Secondary | ICD-10-CM

## 2015-03-24 DIAGNOSIS — E119 Type 2 diabetes mellitus without complications: Secondary | ICD-10-CM

## 2015-03-24 DIAGNOSIS — S2231XS Fracture of one rib, right side, sequela: Secondary | ICD-10-CM | POA: Diagnosis not present

## 2015-03-24 DIAGNOSIS — K219 Gastro-esophageal reflux disease without esophagitis: Secondary | ICD-10-CM

## 2015-03-24 DIAGNOSIS — R1310 Dysphagia, unspecified: Secondary | ICD-10-CM | POA: Diagnosis not present

## 2015-03-24 DIAGNOSIS — S62501S Fracture of unspecified phalanx of right thumb, sequela: Secondary | ICD-10-CM | POA: Diagnosis not present

## 2015-03-24 DIAGNOSIS — R600 Localized edema: Secondary | ICD-10-CM | POA: Diagnosis not present

## 2015-03-24 DIAGNOSIS — R748 Abnormal levels of other serum enzymes: Secondary | ICD-10-CM

## 2015-03-24 DIAGNOSIS — R5381 Other malaise: Secondary | ICD-10-CM

## 2015-03-24 DIAGNOSIS — Z794 Long term (current) use of insulin: Secondary | ICD-10-CM

## 2015-03-24 DIAGNOSIS — I10 Essential (primary) hypertension: Secondary | ICD-10-CM

## 2015-03-24 DIAGNOSIS — N179 Acute kidney failure, unspecified: Secondary | ICD-10-CM

## 2015-03-24 DIAGNOSIS — IMO0001 Reserved for inherently not codable concepts without codable children: Secondary | ICD-10-CM
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
## 2015-04-01 ENCOUNTER — Non-Acute Institutional Stay (SKILLED_NURSING_FACILITY): Payer: Medicare Other | Admitting: Nurse Practitioner

## 2015-04-01 DIAGNOSIS — F32A Depression, unspecified: Secondary | ICD-10-CM

## 2015-04-01 DIAGNOSIS — R740 Nonspecific elevation of levels of transaminase and lactic acid dehydrogenase [LDH]: Secondary | ICD-10-CM | POA: Diagnosis not present

## 2015-04-01 DIAGNOSIS — Z794 Long term (current) use of insulin: Secondary | ICD-10-CM | POA: Diagnosis not present

## 2015-04-01 DIAGNOSIS — N189 Chronic kidney disease, unspecified: Secondary | ICD-10-CM

## 2015-04-01 DIAGNOSIS — Z720 Tobacco use: Secondary | ICD-10-CM | POA: Diagnosis not present

## 2015-04-01 DIAGNOSIS — I10 Essential (primary) hypertension: Secondary | ICD-10-CM | POA: Diagnosis not present

## 2015-04-01 DIAGNOSIS — E119 Type 2 diabetes mellitus without complications: Secondary | ICD-10-CM

## 2015-04-01 DIAGNOSIS — N179 Acute kidney failure, unspecified: Secondary | ICD-10-CM

## 2015-04-01 DIAGNOSIS — R7401 Elevation of levels of liver transaminase levels: Secondary | ICD-10-CM

## 2015-04-01 DIAGNOSIS — F329 Major depressive disorder, single episode, unspecified: Secondary | ICD-10-CM

## 2015-04-01 DIAGNOSIS — IMO0001 Reserved for inherently not codable concepts without codable children: Secondary | ICD-10-CM

## 2015-04-01 NOTE — Progress Notes (Signed)
Patient ID: Collin Goodwin, male   DOB: 1956/01/09, 59 y.o.   MRN: [id number]    Nursing Home Location:  [facility]   Place of Service: SNF (31)  PCP: PROVIDER NOT IN SYSTEM  No Known Allergies  Chief Complaint  Patient presents with  . Discharge Note    HPI:  Patient is a 59 y.o. male seen today at [facility] for discharge home. Pt has PMH of DM, ckd, HTN, chronic pancreatitis among others He is here for short term rehabilitation after hospitalization from 03/12/15-03/19/15 with acute encephalopathy thought to be metabolic with electrolytes derangement and hyperglycemia. He was in DKA and required insulin gtt. He had abnormal LFT with alkaline phos mainly elevated. He was started empirically on antibiotics and GI was consulted and conts to follow.  MRCP was done but findings were limited with artifacts. He also had CT scan ruling out obstruction. Pt liver enzymes has been stable while at [facility]. Following [facility].  Patient currently doing well with therapy, now stable to discharge home with outpatient follow up.  Review of Systems:  Review of Systems  Constitutional: Negative for activity change, appetite change, fatigue and unexpected weight change.  HENT: Negative for congestion and hearing loss.   Eyes: Negative.   Respiratory: Negative for cough and shortness of breath.   Cardiovascular: Negative for chest pain, palpitations and leg swelling.  Gastrointestinal: Positive for abdominal pain (occasional mild pain). Negative for diarrhea and constipation.  Genitourinary: Negative for dysuria and difficulty urinating.  Musculoskeletal: Negative for myalgias and arthralgias.  Skin: Negative for color change and wound.  Neurological: Negative for dizziness and weakness.  Psychiatric/Behavioral: Negative for behavioral problems, confusion and agitation.    Past Medical History  Diagnosis Date  . Diabetes mellitus without complication  (HCC)   . Pancreatitis    Past Surgical History  Procedure Laterality Date  . Appendectomy    . Orif wrist fracture    . Toe surgery     Social History:   reports that he has been smoking.  He does not have any smokeless tobacco history on file. He reports that he drinks alcohol. He reports that he does not use illicit drugs.  Family History  Problem Relation Age of Onset  . Arthritis Mother   . Pancreatitis Father   . Liver cancer Brother   . Heart disease Brother   . Lupus Brother     Medications: Patient's Medications  New Prescriptions   No medications on file  Previous Medications   ACETAMINOPHEN (TYLENOL) 500 MG TABLET    Take 500 mg by mouth at bedtime as needed for mild pain or headache.   CA CARBONATE-MAG HYDROXIDE (ROLAIDS PO)    Take 2 tablets by mouth as needed (for heartburn).   CITALOPRAM (CELEXA) 20 MG TABLET    Take 20 mg by mouth daily.   HYDROCORTISONE CREAM 1 %    Apply 1 application topically daily.   INSULIN ASPART (NOVOLOG) 100 UNIT/ML INJECTION    Inject 0-9 Units into the skin 3 (three) times daily with meals. Sliding scale CBG 70 - 120: 0 units CBG 121 - 150: 1 unit,  CBG 151 - 200: 2 units,  CBG 201 - 250: 3 units,  CBG 251 - 300: 5 units,  CBG 301 - 350: 7 units,  CBG 351 - 400: 9 units   CBG > 400: 9 units and notify your MD   LIPASE/PROTEASE/AMYLASE 78295 UNITS CPEP  Take 1 capsule (24,000 Units total) by mouth 3 (three) times daily.   NICOTINE (NICODERM CQ - DOSED IN MG/24 HOURS) 14 MG/24HR PATCH    Place 1 patch (14 mg total) onto the skin daily.   OXYCODONE-ACETAMINOPHEN (PERCOCET/ROXICET) 5-325 MG TABLET    Take 1-2 tablets by mouth every 6 (six) hours as needed for severe pain.   PANTOPRAZOLE (PROTONIX) 40 MG TABLET    Take 1 tablet (40 mg total) by mouth 2 (two) times daily.   PRAMOXINE-MENTHOL (GOLD BOND MEDICATED ANTI ITCH) 1-1 % CREA    Apply 1 application topically daily.   PROPRANOLOL (INDERAL) 40 MG TABLET    Take 1 tablet (40 mg total)  by mouth daily.   VITAMIN D, ERGOCALCIFEROL, (DRISDOL) 50000 UNITS CAPS CAPSULE    Take 50,000 Units by mouth. Monday and Thursday  Modified Medications   No medications on file  Discontinued Medications   No medications on file     Physical Exam: Filed Vitals:   04/01/15 1306  BP: 104/64  Pulse: 74  Temp: 97.4 F (36.3 C)  Resp: 20  Weight: 170 lb (77.111 kg)    Physical Exam  Constitutional: He is oriented to person, place, and time. He appears well-developed and well-nourished. No distress.  HENT:  Head: Normocephalic and atraumatic.  Mouth/Throat: Oropharynx is clear and moist. No oropharyngeal exudate.  Eyes: Conjunctivae and EOM are normal. Pupils are equal, round, and reactive to light.  Neck: Normal range of motion. Neck supple.  Cardiovascular: Normal rate, regular rhythm and normal heart sounds.   Pulmonary/Chest: Effort normal and breath sounds normal.  Abdominal: Soft. Bowel sounds are normal. He exhibits no distension. There is no tenderness.  Musculoskeletal: He exhibits no edema or tenderness.  Neurological: He is alert and oriented to person, place, and time.  Skin: Skin is warm and dry. He is not diaphoretic.  Psychiatric: He has a normal mood and affect.    Labs reviewed: Basic Metabolic Panel:  Recent Labs  09/81/1910/04/26 2047  03/17/15 0530 03/18/15 0559 03/19/15 0738  NA 136  < > 134* 140 138  K 3.7  < > 3.7 3.6 4.3  CL 105  < > 104 107 106  CO2 22  < > 27 28 22   GLUCOSE 135*  < > 129* 24* 106*  BUN 19  < > <5* <5* 6  CREATININE 1.28*  < > 0.75 0.61 0.76  CALCIUM 6.6*  < > 7.6* 8.2* 8.1*  MG 1.5*  --   --   --   --   < > = values in this interval not displayed. Liver Function Tests:  Recent Labs  03/17/15 0530 03/18/15 0559 03/19/15 0738  AST 167* 134* 98*  ALT 90* 91* 75*  ALKPHOS 675* 973* 986*  BILITOT 1.6* 1.5* 1.4*  PROT 4.0* 4.8* 4.3*  ALBUMIN 1.3* 1.4* 1.3*    Recent Labs  02/08/15 1425 03/16/15 0918  LIPASE 12 12   No  results for input(s): AMMONIA in the last 8760 hours. CBC:  Recent Labs  04/16/14 1800  02/08/15 1521  03/12/15 0930  03/16/15 0546 03/17/15 0530 03/18/15 0559  WBC 5.4  --  4.1  < > 18.7*  < > 8.5 7.7 7.3  NEUTROABS 3.1  --  1.9  --  16.7*  --   --   --   --   HGB 12.2*  < > 10.2*  < > 12.2*  < > 10.2* 8.3* 9.7*  HCT 37.7*  < >  30.8*  < > 34.4*  < > 30.1* 25.0* 29.9*  MCV 71.3*  --  66.7*  < > 66.0*  < > 68.3* 69.1* 69.1*  PLT 188  --  238  < > PLATELET CLUMPS NOTED ON SMEAR, UNABLE TO ESTIMATE  < > 214 251 380  < > = values in this interval not displayed. TSH:  Recent Labs  02/09/15 0540 03/12/15 2047  TSH 1.599 1.087   A1C: Lab Results  Component Value Date   HGBA1C 10.3* 03/12/2015   Lipid Panel: No results for input(s): CHOL, HDL, LDLCALC, TRIG, CHOLHDL, LDLDIRECT in the last 8760 hours.  03/24/2015 8:39PM TEST RESULT REF RANGE UNIT SODIUM 139 135-146 mmol/L POTASSIUM 4.4 3.5-5.3 mmol/L CHLORIDE 102 95-109 mmol/L CO2 30.5 21.0-31.0 mmol/L Anion Gap 7 5-21 meq/L GLUCOSE H 215 70-105 mg/dL UREA NITROGEN (BUN) 7 1-61 mg/dL Creatinine, Serum 0.96 0.60-1.30 mg/dL BUN/CREATININE RATIO 9.7 6.0-25.0 Ratio CALCIUM L 7.5 8.2-10.5 mg/dL PROTEIN, TOTAL L 4.3 0.4-5.4 g/dL ALBUMIN L <0.98 1.19-1.47 g/dL GLOBULIN 2.8 8.2-9.5 g/dL ALBUMIN/GLOBULIN RATIO L 0.5 0.8-2.5 Ratio BILIRUBIN, TOTAL 0.83 0.00-1.40 mg/dL ALKALINE PHOSPHATASE H 425 41-130 U/L AST (SGOT) H 60 0-39 U/L ALT (SGPT) H 53 0-45 U/L Glomerular Filtration Rate > 60 > 60 mls/min/1.73 m2 Glomerular Filtration Rate - African American > 60 > 60 mls/min/1.73 m2  Assessment/Plan 1. IDDM (insulin dependent diabetes mellitus) (HCC) lantus was stopped in hospital due to hypoglycemia, pt has had occasional low blood sugar while at [facility]. Encouraged proper food choices and eating meals regularly. conts on novolog SSI   2. Benign essential HTN Blood pressure low during hospitalization, stable at [facility]  place not currently on medications  3. Acute kidney injury superimposed on chronic kidney disease (HCC) Improved, most recent Cr of 0.72  4. Tobacco abuse Stable, conts on nicotine patch  5. Depression Stable, conts on celexa daily  6. Hypocalcemia Most recent calcium of 7.5, will need ongoing outpt follow up   7. Transaminitis Liver enzymes trending down. Abdominal pain stable, conts to take percocet however due to frequency of use will DC percocet and start oxycodone 5 mg 1-2 q 6 hours PRN to decrease tylenol use.  Follow up by [facility]  8. Edema -improved on lasix by mouth daily, will cont at this time   pt is stable for discharge-does not wish to receive HH therapies. Therapy feels like this is okay at this time. No DME needed. Rx written.  will need to follow up with PCP within 2 weeks.     Janene Harvey. Biagio Borg  [facility] [phone] 8 am - 5 pm) [phone] (after hours)

## 2015-08-10 DEATH — deceased
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
